# Patient Record
Sex: Female | Born: 1955 | Race: White | Hispanic: No | Marital: Married | Smoking: Former smoker
Health system: Southern US, Community
[De-identification: ages and names within clinical notes are randomized; demographics above are authoritative.]

## PROBLEM LIST (undated history)

## (undated) DIAGNOSIS — Z8719 Personal history of other diseases of the digestive system: Secondary | ICD-10-CM

## (undated) DIAGNOSIS — C189 Malignant neoplasm of colon, unspecified: Secondary | ICD-10-CM

## (undated) DIAGNOSIS — M797 Fibromyalgia: Secondary | ICD-10-CM

## (undated) DIAGNOSIS — I10 Essential (primary) hypertension: Secondary | ICD-10-CM

## (undated) HISTORY — PX: REPLACEMENT TOTAL KNEE: SUR1224

## (undated) HISTORY — PX: PORTACATH PLACEMENT: SHX2246

---

## 2018-05-05 ENCOUNTER — Inpatient Hospital Stay
Admission: EM | Admit: 2018-05-05 | Discharge: 2018-05-09 | DRG: 872 | Disposition: A | Discharge status: Home Health | Payer: Managed Care, Other (non HMO) | Attending: Internal Medicine | Admitting: Internal Medicine

## 2018-05-05 ENCOUNTER — Other Ambulatory Visit: Payer: Self-pay

## 2018-05-05 ENCOUNTER — Encounter: Payer: Self-pay | Admitting: Emergency Medicine

## 2018-05-05 ENCOUNTER — Emergency Department: Payer: Managed Care, Other (non HMO)

## 2018-05-05 DIAGNOSIS — M797 Fibromyalgia: Secondary | ICD-10-CM | POA: Diagnosis present

## 2018-05-05 DIAGNOSIS — C7951 Secondary malignant neoplasm of bone: Secondary | ICD-10-CM | POA: Diagnosis present

## 2018-05-05 DIAGNOSIS — T451X5A Adverse effect of antineoplastic and immunosuppressive drugs, initial encounter: Secondary | ICD-10-CM | POA: Diagnosis present

## 2018-05-05 DIAGNOSIS — D638 Anemia in other chronic diseases classified elsewhere: Secondary | ICD-10-CM | POA: Diagnosis present

## 2018-05-05 DIAGNOSIS — Z85038 Personal history of other malignant neoplasm of large intestine: Secondary | ICD-10-CM | POA: Diagnosis not present

## 2018-05-05 DIAGNOSIS — Z6820 Body mass index (BMI) 20.0-20.9, adult: Secondary | ICD-10-CM | POA: Diagnosis not present

## 2018-05-05 DIAGNOSIS — G620 Drug-induced polyneuropathy: Secondary | ICD-10-CM | POA: Diagnosis present

## 2018-05-05 DIAGNOSIS — Z79899 Other long term (current) drug therapy: Secondary | ICD-10-CM | POA: Diagnosis not present

## 2018-05-05 DIAGNOSIS — E440 Moderate protein-calorie malnutrition: Secondary | ICD-10-CM | POA: Diagnosis present

## 2018-05-05 DIAGNOSIS — B961 Klebsiella pneumoniae [K. pneumoniae] as the cause of diseases classified elsewhere: Secondary | ICD-10-CM | POA: Diagnosis present

## 2018-05-05 DIAGNOSIS — C787 Secondary malignant neoplasm of liver and intrahepatic bile duct: Secondary | ICD-10-CM | POA: Diagnosis present

## 2018-05-05 DIAGNOSIS — R7881 Bacteremia: Secondary | ICD-10-CM | POA: Diagnosis not present

## 2018-05-05 DIAGNOSIS — A409 Streptococcal sepsis, unspecified: Secondary | ICD-10-CM | POA: Diagnosis present

## 2018-05-05 DIAGNOSIS — Z800 Family history of malignant neoplasm of digestive organs: Secondary | ICD-10-CM | POA: Diagnosis not present

## 2018-05-05 DIAGNOSIS — K589 Irritable bowel syndrome without diarrhea: Secondary | ICD-10-CM | POA: Diagnosis present

## 2018-05-05 DIAGNOSIS — Z79891 Long term (current) use of opiate analgesic: Secondary | ICD-10-CM | POA: Diagnosis not present

## 2018-05-05 DIAGNOSIS — Z66 Do not resuscitate: Secondary | ICD-10-CM | POA: Diagnosis present

## 2018-05-05 DIAGNOSIS — I10 Essential (primary) hypertension: Secondary | ICD-10-CM | POA: Diagnosis present

## 2018-05-05 DIAGNOSIS — Z87891 Personal history of nicotine dependence: Secondary | ICD-10-CM | POA: Diagnosis not present

## 2018-05-05 DIAGNOSIS — D709 Neutropenia, unspecified: Secondary | ICD-10-CM | POA: Diagnosis present

## 2018-05-05 DIAGNOSIS — Z9221 Personal history of antineoplastic chemotherapy: Secondary | ICD-10-CM | POA: Diagnosis not present

## 2018-05-05 DIAGNOSIS — R5081 Fever presenting with conditions classified elsewhere: Secondary | ICD-10-CM | POA: Diagnosis present

## 2018-05-05 DIAGNOSIS — C189 Malignant neoplasm of colon, unspecified: Secondary | ICD-10-CM | POA: Diagnosis not present

## 2018-05-05 DIAGNOSIS — R197 Diarrhea, unspecified: Secondary | ICD-10-CM | POA: Diagnosis not present

## 2018-05-05 DIAGNOSIS — R1110 Vomiting, unspecified: Secondary | ICD-10-CM | POA: Diagnosis present

## 2018-05-05 DIAGNOSIS — R000 Tachycardia, unspecified: Secondary | ICD-10-CM | POA: Diagnosis present

## 2018-05-05 HISTORY — DX: Malignant neoplasm of colon, unspecified: C18.9

## 2018-05-05 HISTORY — DX: Fibromyalgia: M79.7

## 2018-05-05 HISTORY — DX: Personal history of other diseases of the digestive system: Z87.19

## 2018-05-05 HISTORY — DX: Essential (primary) hypertension: I10

## 2018-05-05 LAB — CBC WITH DIFFERENTIAL/PLATELET
BASOS ABS: 0 10*3/uL (ref 0–0.1)
BASOS PCT: 1 %
EOS ABS: 0 10*3/uL (ref 0–0.7)
Eosinophils Relative: 2 %
HCT: 30.5 % — ABNORMAL LOW (ref 35.0–47.0)
HEMOGLOBIN: 10.2 g/dL — AB (ref 12.0–16.0)
Lymphocytes Relative: 66 %
Lymphs Abs: 0.2 10*3/uL — ABNORMAL LOW (ref 1.0–3.6)
MCH: 28.9 pg (ref 26.0–34.0)
MCHC: 33.5 g/dL (ref 32.0–36.0)
MCV: 86.1 fL (ref 80.0–100.0)
MONO ABS: 0 10*3/uL — AB (ref 0.2–0.9)
Monocytes Relative: 14 %
NEUTROS ABS: 0.1 10*3/uL — AB (ref 1.4–6.5)
NEUTROS PCT: 17 %
Platelets: 191 10*3/uL (ref 150–440)
RBC: 3.54 MIL/uL — ABNORMAL LOW (ref 3.80–5.20)
RDW: 19.6 % — AB (ref 11.5–14.5)
WBC: 0.3 10*3/uL — CL (ref 3.6–11.0)

## 2018-05-05 LAB — URINALYSIS, ROUTINE W REFLEX MICROSCOPIC
BILIRUBIN URINE: NEGATIVE
Bacteria, UA: NONE SEEN
Glucose, UA: NEGATIVE mg/dL
HGB URINE DIPSTICK: NEGATIVE
Ketones, ur: NEGATIVE mg/dL
LEUKOCYTES UA: NEGATIVE
Nitrite: NEGATIVE
PROTEIN: 30 mg/dL — AB
Specific Gravity, Urine: 1.042 — ABNORMAL HIGH (ref 1.005–1.030)
pH: 6 (ref 5.0–8.0)

## 2018-05-05 LAB — LACTIC ACID, PLASMA: LACTIC ACID, VENOUS: 1.7 mmol/L (ref 0.5–1.9)

## 2018-05-05 LAB — COMPREHENSIVE METABOLIC PANEL
ALT: 22 U/L (ref 0–44)
ANION GAP: 11 (ref 5–15)
AST: 25 U/L (ref 15–41)
Albumin: 3.4 g/dL — ABNORMAL LOW (ref 3.5–5.0)
Alkaline Phosphatase: 205 U/L — ABNORMAL HIGH (ref 38–126)
BUN: 19 mg/dL (ref 8–23)
CALCIUM: 9.5 mg/dL (ref 8.9–10.3)
CO2: 22 mmol/L (ref 22–32)
Chloride: 101 mmol/L (ref 98–111)
Creatinine, Ser: 0.92 mg/dL (ref 0.44–1.00)
GFR calc Af Amer: >60 mL/min (ref >60)
Glucose, Bld: 163 mg/dL — ABNORMAL HIGH (ref 70–99)
POTASSIUM: 3.7 mmol/L (ref 3.5–5.1)
Sodium: 134 mmol/L — ABNORMAL LOW (ref 135–145)
TOTAL PROTEIN: 7.4 g/dL (ref 6.5–8.1)
Total Bilirubin: 2.7 mg/dL — ABNORMAL HIGH (ref 0.3–1.2)

## 2018-05-05 LAB — URINALYSIS, COMPLETE (UACMP) WITH MICROSCOPIC
BACTERIA UA: NONE SEEN
BILIRUBIN URINE: NEGATIVE
GLUCOSE, UA: NEGATIVE mg/dL
HGB URINE DIPSTICK: NEGATIVE
Ketones, ur: NEGATIVE mg/dL
LEUKOCYTES UA: NEGATIVE
NITRITE: NEGATIVE
PH: 6 (ref 5.0–8.0)
Protein, ur: 30 mg/dL — AB
SPECIFIC GRAVITY, URINE: 1.042 — AB (ref 1.005–1.030)

## 2018-05-05 LAB — PROTIME-INR
INR: 1.1
PROTHROMBIN TIME: 14.1 s (ref 11.4–15.2)

## 2018-05-05 MED ORDER — ACETAMINOPHEN 325 MG PO TABS
650.0000 mg | ORAL_TABLET | Freq: Four times a day (QID) | ORAL | Status: DC | PRN
  Administered 2018-05-05: 650 mg via ORAL
  Filled 2018-05-05: qty 2

## 2018-05-05 MED ORDER — PREGABALIN 50 MG PO CAPS
100.0000 mg | ORAL_CAPSULE | Freq: Two times a day (BID) | ORAL | Status: DC
Start: 2018-05-05 — End: 2018-05-05

## 2018-05-05 MED ORDER — LINACLOTIDE 145 MCG PO CAPS
145.0000 ug | ORAL_CAPSULE | Freq: Every day | ORAL | Status: DC
  Filled 2018-05-05 (×2): qty 1

## 2018-05-05 MED ORDER — PREGABALIN 50 MG PO CAPS
100.0000 mg | ORAL_CAPSULE | Freq: Every day | ORAL | Status: DC
  Administered 2018-05-06 – 2018-05-09 (×4): 100 mg via ORAL
  Filled 2018-05-05 (×4): qty 2

## 2018-05-05 MED ORDER — IOHEXOL 300 MG/ML  SOLN
100.0000 mL | Freq: Once | INTRAMUSCULAR | Status: AC | PRN
  Administered 2018-05-05: 100 mL via INTRAVENOUS

## 2018-05-05 MED ORDER — PREGABALIN 75 MG PO CAPS
200.0000 mg | ORAL_CAPSULE | Freq: Every day | ORAL | Status: DC
  Administered 2018-05-06 – 2018-05-08 (×3): 200 mg via ORAL
  Filled 2018-05-05 (×4): qty 1

## 2018-05-05 MED ORDER — ONDANSETRON HCL 4 MG PO TABS
4.0000 mg | ORAL_TABLET | Freq: Four times a day (QID) | ORAL | Status: DC | PRN
  Administered 2018-05-09: 05:00:00 4 mg via ORAL
  Filled 2018-05-05: qty 1

## 2018-05-05 MED ORDER — MELOXICAM 7.5 MG PO TABS
15.0000 mg | ORAL_TABLET | Freq: Every day | ORAL | Status: DC
  Administered 2018-05-06 – 2018-05-09 (×4): 15 mg via ORAL
  Filled 2018-05-05 (×4): qty 2

## 2018-05-05 MED ORDER — LOPERAMIDE HCL 2 MG PO CAPS: 2.0000 mg | ORAL_CAPSULE | Freq: Four times a day (QID) | ORAL | Status: DC | PRN

## 2018-05-05 MED ORDER — VANCOMYCIN HCL IN DEXTROSE 1-5 GM/200ML-% IV SOLN
1000.0000 mg | Freq: Once | INTRAVENOUS | Status: AC
  Administered 2018-05-05: 1000 mg via INTRAVENOUS
  Filled 2018-05-05: qty 200

## 2018-05-05 MED ORDER — ACETAMINOPHEN 650 MG RE SUPP: 650.0000 mg | Freq: Four times a day (QID) | RECTAL | Status: DC | PRN

## 2018-05-05 MED ORDER — SODIUM CHLORIDE 0.9 % IV SOLN
INTRAVENOUS | Status: DC
  Administered 2018-05-05 – 2018-05-09 (×8): via INTRAVENOUS

## 2018-05-05 MED ORDER — AMLODIPINE BESYLATE 10 MG PO TABS
10.0000 mg | ORAL_TABLET | Freq: Every day | ORAL | Status: DC
  Administered 2018-05-06 – 2018-05-09 (×4): 10 mg via ORAL
  Filled 2018-05-05 (×4): qty 1

## 2018-05-05 MED ORDER — ONDANSETRON HCL 4 MG/2ML IJ SOLN: 4.0000 mg | Freq: Four times a day (QID) | INTRAMUSCULAR | Status: DC | PRN

## 2018-05-05 MED ORDER — ONDANSETRON HCL 4 MG/2ML IJ SOLN
INTRAMUSCULAR | Status: AC
  Administered 2018-05-05: 18:00:00
  Filled 2018-05-05: qty 2

## 2018-05-05 MED ORDER — SODIUM CHLORIDE 0.9 % IV BOLUS
1000.0000 mL | Freq: Once | INTRAVENOUS | Status: AC
  Administered 2018-05-05: 1000 mL via INTRAVENOUS

## 2018-05-05 MED ORDER — SENNA 8.6 MG PO TABS
8.6000 mg | ORAL_TABLET | Freq: Every day | ORAL | Status: DC
  Filled 2018-05-05 (×2): qty 1

## 2018-05-05 MED ORDER — TRAMADOL HCL 50 MG PO TABS
50.0000 mg | ORAL_TABLET | Freq: Three times a day (TID) | ORAL | Status: DC | PRN
  Administered 2018-05-09: 50 mg via ORAL
  Filled 2018-05-05: qty 1

## 2018-05-05 MED ORDER — SODIUM CHLORIDE 0.9 % IV SOLN
2.0000 g | Freq: Two times a day (BID) | INTRAVENOUS | Status: DC
  Administered 2018-05-05 – 2018-05-06 (×2): 2 g via INTRAVENOUS
  Filled 2018-05-05 (×4): qty 2

## 2018-05-05 MED ORDER — METOPROLOL SUCCINATE ER 50 MG PO TB24
100.0000 mg | ORAL_TABLET | Freq: Every day | ORAL | Status: DC
  Administered 2018-05-06 – 2018-05-09 (×4): 100 mg via ORAL
  Filled 2018-05-05 (×4): qty 2

## 2018-05-05 MED ORDER — PIPERACILLIN-TAZOBACTAM 3.375 G IVPB 30 MIN
3.3750 g | Freq: Once | INTRAVENOUS | Status: AC
  Administered 2018-05-05: 3.375 g via INTRAVENOUS
  Filled 2018-05-05: qty 50

## 2018-05-05 MED ORDER — ENOXAPARIN SODIUM 40 MG/0.4ML ~~LOC~~ SOLN
40.0000 mg | SUBCUTANEOUS | Status: DC
  Administered 2018-05-06 – 2018-05-08 (×3): 40 mg via SUBCUTANEOUS
  Filled 2018-05-05 (×4): qty 0.4

## 2018-05-05 MED ORDER — VANCOMYCIN HCL IN DEXTROSE 1-5 GM/200ML-% IV SOLN
1000.0000 mg | INTRAVENOUS | Status: DC
  Administered 2018-05-06: 1000 mg via INTRAVENOUS
  Filled 2018-05-05 (×2): qty 200

## 2018-05-05 MED ORDER — LISINOPRIL 20 MG PO TABS
20.0000 mg | ORAL_TABLET | Freq: Every day | ORAL | Status: DC
  Administered 2018-05-06 – 2018-05-07 (×2): 20 mg via ORAL
  Filled 2018-05-05 (×2): qty 1

## 2018-05-05 MED ORDER — MILNACIPRAN HCL 50 MG PO TABS
50.0000 mg | ORAL_TABLET | Freq: Two times a day (BID) | ORAL | Status: DC
  Administered 2018-05-06 – 2018-05-09 (×7): 50 mg via ORAL
  Filled 2018-05-05 (×15): qty 1

## 2018-05-05 NOTE — Progress Notes (Signed)
CODE SEPSIS - PHARMACY COMMUNICATION  **Broad Spectrum Antibiotics should be administered within 1 hour of Sepsis diagnosis**  Time Code Sepsis Called/Page Received:  1805  Antibiotics Ordered: Vanc/Zosyn  Time of 1st antibiotic administration: 1834  Additional action taken by pharmacy:   If necessary, Name of Provider/Nurse Contacted:     Rocky Morel ,PharmD Clinical Pharmacist  05/05/2018  6:26 PM

## 2018-05-05 NOTE — ED Notes (Signed)
Patient transported to X-ray 

## 2018-05-05 NOTE — ED Notes (Signed)
Report called to [facility] RN and pt updated with plan of care. VSS.

## 2018-05-05 NOTE — ED Notes (Signed)
FIRST NURSE NOTE:  Pt has had N/V, fever at home of 103, stage IV colon CA.

## 2018-05-05 NOTE — ED Notes (Signed)
Nicole RN, aware of bed assigned  

## 2018-05-05 NOTE — Progress Notes (Signed)
   [place] at [facility] Day: 0 days Leslie Wallace is a 62 y.o. female presenting with Fever .   Advance care planning discussed with patient and her husband at bedside. All questions in regards to overall condition and expected prognosis answered.  She has met a palliative care physician in [place].  She is okay with all the care until cardiac arrest and wants to be a DNR if cardiac arrest happens.    CODE STATUS: DO NOT RESUSCITATE Time spent: 18 minutes

## 2018-05-05 NOTE — ED Provider Notes (Addendum)
[facility] Emergency Department Provider Note  Time seen: 6:03 PM  I have reviewed the triage vital signs and the nursing notes.   HISTORY  Chief Complaint Fever    HPI Leslie Wallace is a 62 y.o. 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  According to the patient she often will become nauseated with episodes of vomiting but states it has been much worse since last night throughout the day today.  Developed a fever 103 at home today so the patient came to the emergency department for evaluation.  Patient denies any abdominal pain, cough or congestion denies dysuria or cloudy urine.  Largely negative review of systems.  States she has had a very small amount of diarrhea today but has been able to pass gas.  States frequent vomiting now bilious vomiting.   Past Medical History:  Diagnosis Date  . Colon cancer ([place])   . Fibromyalgia   . History of IBS   . Hypertension     There are no active problems to display for this patient.   Prior to Admission medications   Not on File    No Known Allergies  No family history on file.  Social History Social History   Tobacco Use  . Smoking status: Former Research scientist (life sciences)  . Smokeless tobacco: Never Used  Substance Use Topics  . Alcohol use: Not Currently    Comment: Rare  . Drug use: Never    Review of Systems Constitutional: Positive for fever at home. Eyes: Negative for visual complaints ENT: Negative for recent illness/congestion Cardiovascular: Negative for chest pain. Respiratory: Negative for shortness of breath. Gastrointestinal: Negative for abdominal pain positive for nausea vomiting.  Small amount of diarrhea today.  Positive for flatus. Genitourinary: Negative for urinary compaints Musculoskeletal: Negative for musculoskeletal  complaints Skin: Negative for skin complaints  Neurological: Negative for headache All other ROS negative  ____________________________________________   PHYSICAL EXAM:  VITAL SIGNS: ED Triage Vitals  Enc Vitals Group     BP --      Pulse Rate 05/05/18 1719 (!) 122     Resp 05/05/18 1719 (!) 22     Temp 05/05/18 1719 99.4 F (37.4 C)     Temp Source 05/05/18 1719 Oral     SpO2 --      Weight 05/05/18 1716 121 lb (54.9 kg)     Height 05/05/18 1716 5\' 4"  (1.626 m)     Head Circumference --      Peak Flow --      Pain Score 05/05/18 1715 5     Pain Loc --      Pain Edu? --      Excl. in [place]? --    Constitutional: Alert and oriented. Well appearing and in no distress. Eyes: Normal exam ENT   Head: Normocephalic and atraumatic.   Mouth/Throat: Mucous membranes are moist. Cardiovascular: Regular rhythm rate around 120 bpm.  No obvious murmur. Respiratory: Normal respiratory effort without tachypnea nor retractions. Breath sounds are clear Gastrointestinal: Soft and nontender. No distention.   Musculoskeletal: Nontender with normal range of motion in all extremities. Neurologic:  Normal speech and language. No gross focal neurologic deficits  Skin:  Skin is warm, dry and intact.  Psychiatric: Mood and affect are normal.  ____________________________________________    EKG  EKG reviewed and interpreted by myself shows sinus tachycardia  122 bpm with a narrow QRS, normal axis, largely normal intervals, nonspecific ST changes.  No ST elevation.  ____________________________________________    RADIOLOGY  IMPRESSION: Loops of mildly dilated bowel without air-fluid levels. Question a degree of ileus or enteritis. Bowel obstruction felt to be less likely. No evident free air. Probable metastatic disease involving the right sacral ala.  IMPRESSION: Port-A-Cath tip at cavoatrial junction. No pneumothorax. No edema or consolidation. No adenopathy. Old trauma proximal  left humerus with healing.  IMPRESSION: 1. Fluid-filled loops of colon with mild mucosal enhancement which may reflect an ileus versus mild early colitis. 2. Multiple hepatic hypodense masses most consistent with metastatic disease. Sclerotic bone lesion in the right side of the sacrum, right posterior ilium most concerning for metastatic disease. 3. 4.1 x 8.9 cm cystic mass between the uterus and rectum of uncertain etiology. This may reflect a cystic ovarian neoplasm versus pelvic inclusion cyst versus postoperative seroma.  ____________________________________________   INITIAL IMPRESSION / ASSESSMENT AND PLAN / ED COURSE  Pertinent labs & imaging results that were available during my care of the patient were reviewed by me and considered in my medical decision making (see chart for details).  Patient presents to the emergency department for fever nausea vomiting.  Given the patient's tachycardia and fever she meets sepsis criteria.  Differential would include infectious etiology such as pneumonia, urinary tract infection, hematologic infection, neutropenic fever, abdominal infection.  We will check labs, cultures, start broad-spectrum antibiotics and IV fluids.  I have ordered chest and abdominal imaging.  Chest x-ray appears to be clear on my evaluation.  Abdominal imaging shows several largely distended loops of intestine.  With the patient's vomiting we will proceed with CT imaging to help rule out a bowel obstruction.  CT negative for bowel obstruction does show fluid-filled loops of colon possible colitis versus ileus.  Patient's labs have resulted showing neutropenia, receiving broad-spectrum antibiotics.  Will admit to the hospitalist service for continued treatment for neutropenic fever of unknown origin possibly related to mild colitis.  Patient agreeable to plan of care.  CRITICAL CARE Performed by: Harvest Dark   Total critical care time: 30 minutes  Critical care  time was exclusive of separately billable procedures and treating other patients.  Critical care was necessary to treat or prevent imminent or life-threatening deterioration.  Critical care was time spent personally by me on the following activities: development of treatment plan with patient and/or surrogate as well as nursing, discussions with consultants, evaluation of patient's response to treatment, examination of patient, obtaining history from patient or surrogate, ordering and performing treatments and interventions, ordering and review of laboratory studies, ordering and review of radiographic studies, pulse oximetry and re-evaluation of patient's condition.   ____________________________________________   FINAL CLINICAL IMPRESSION(S) / ED DIAGNOSES  Nausea vomiting Fever    Harvest Dark, MD 05/05/18 2029    Harvest Dark, MD 05/05/18 2029

## 2018-05-05 NOTE — ED Notes (Signed)
Patient transported to CT 

## 2018-05-05 NOTE — H&P (Signed)
[place] at [place] NAME: Leslie Wallace    MR#:  [id number]  DATE OF BIRTH:  06/25/1956  DATE OF ADMISSION:  05/05/2018  PRIMARY CARE PHYSICIAN: System, Pcp Not In   REQUESTING/REFERRING PHYSICIAN: Dr. Harvest Dark  CHIEF COMPLAINT:   Chief Complaint  Patient presents with  . Fever    HISTORY OF PRESENT ILLNESS:  Leslie Wallace  is a 62 y.o. female with a known history of metastatic colon cancer diagnosed in 2017, with metastatic disease to liver and bones currently on chemotherapy, fibromyalgia, hypertension and irritable bowel syndrome who is visiting her mom here from [place] presents to hospital secondary to fevers. Patient was diagnosed with colon cancer in 2017, finished chemotherapy, however progression of the disease noted in 2018 finished a series of chemotherapy.  She has had trouble with neutropenic fevers at the time.  Most recent scan was about a month ago showing progression of disease in the liver.  She is on palliative chemotherapy and has last dose with high-dose irinotecan was about a week ago.  Patient always had constipation and diarrhea alternating due to her IBS.  She came to visit her mom and was noticed to be very weak and having chills today.  Her husband checked her temperature and it was 103 F at home.  Patient denies any new complaints.  No dysuria or increased frequency of urination.  White count was 0.3K with an absolute neutrophil count of 51.  She is being admitted for neutropenic fevers at this time.  PAST MEDICAL HISTORY:   Past Medical History:  Diagnosis Date  . Colon cancer ([place])    stage 4- mets to liver adn bone  . Fibromyalgia   . History of IBS   . Hypertension     PAST SURGICAL HISTORY:   Past Surgical History:  Procedure Laterality Date  . PORTACATH PLACEMENT    . REPLACEMENT TOTAL KNEE      SOCIAL HISTORY:   Social History   Tobacco Use  . Smoking status: Former Research scientist (life sciences)  .  Smokeless tobacco: Never Used  . Tobacco comment: quit about 12 years ago  Substance Use Topics  . Alcohol use: Not Currently    Comment: Rare    FAMILY HISTORY:   Family History  Problem Relation Age of Onset  . Colon cancer Father     DRUG ALLERGIES:  No Known Allergies  REVIEW OF SYSTEMS:   Review of Systems  Constitutional: Positive for chills, fever and malaise/fatigue. Negative for weight loss.  HENT: Negative for ear discharge, ear pain, hearing loss, nosebleeds and tinnitus.   Eyes: Negative for blurred vision, double vision and photophobia.  Respiratory: Negative for cough, hemoptysis, shortness of breath and wheezing.   Cardiovascular: Negative for chest pain, palpitations, orthopnea and leg swelling.  Gastrointestinal: Positive for constipation, diarrhea and nausea. Negative for abdominal pain, heartburn, melena and vomiting.  Genitourinary: Negative for dysuria, frequency and urgency.  Musculoskeletal: Negative for back pain, myalgias and neck pain.  Skin: Negative for rash.  Neurological: Negative for dizziness, tingling, sensory change, speech change, focal weakness and headaches.  Endo/Heme/Allergies: Does not bruise/bleed easily.  Psychiatric/Behavioral: Negative for depression.    MEDICATIONS AT HOME:   Prior to Admission medications   Medication Sig Start Date End Date Taking? Authorizing Provider  amLODipine (NORVASC) 10 MG tablet Take 10 mg by mouth daily.   Yes [provider]  denosumab (XGEVA) 120 MG/1.7ML SOLN injection Inject 120 mg into  the skin every 28 (twenty-eight) days.   Yes [provider]  dexamethasone (DECADRON) 4 MG tablet Take 8 mg by mouth every evening. On days 2 and 3 of each cycle   Yes [provider]  linaclotide (LINZESS) 145 MCG CAPS capsule Take 145 mg by mouth daily.   Yes [provider]  lisinopril (PRINIVIL,ZESTRIL) 20 MG tablet Take 20 mg by mouth daily.   Yes [provider]    loperamide (IMODIUM) 2 MG capsule Take 2 mg by mouth 4 (four) times daily as needed for diarrhea or loose stools.    Yes [provider]  meloxicam (MOBIC) 15 MG tablet Take 15 mg by mouth daily.   Yes [provider]  metoprolol succinate (TOPROL-XL) 100 MG 24 hr tablet Take 100 mg by mouth daily.   Yes [provider]  Milnacipran (SAVELLA) 50 MG TABS tablet Take 50 mg by mouth 2 (two) times daily.   Yes [provider]  ondansetron (ZOFRAN) 8 MG tablet Take 8 mg by mouth every 8 (eight) hours as needed for nausea or vomiting.    Yes [provider]  pregabalin (LYRICA) 100 MG capsule Take 100-200 mg by mouth 2 (two) times daily. 100 mg every morning and 200 mg at bedtime   Yes [provider]  senna (SENNA LAXATIVE) 8.6 MG tablet Take 8.6 mg by mouth at bedtime.   Yes [provider]  traMADol (ULTRAM) 50 MG tablet Take 50 mg by mouth every 8 (eight) hours as needed for moderate pain or severe pain.    Yes [provider]      VITAL SIGNS:  Blood pressure (!) 144/82, pulse (!) 113, temperature 99.4 F (37.4 C), temperature source Oral, resp. rate (!) 24, height 5\' 4"  (1.626 m), weight 54.9 kg (121 lb), SpO2 99 %.  PHYSICAL EXAMINATION:   Physical Exam  GENERAL:  62 y.o.-year-old ill appearing patient lying in the bed with no acute distress.  EYES: Pupils equal, round, reactive to light and accommodation. No scleral icterus. Extraocular muscles intact.  HEENT: Head atraumatic, normocephalic. Oropharynx and nasopharynx clear.  NECK:  Supple, no jugular venous distention. No thyroid enlargement, no tenderness.  LUNGS: Normal breath sounds bilaterally, no wheezing, rales,rhonchi or crepitation. No use of accessory muscles of respiration. Decreased bibasilar breath sounds CARDIOVASCULAR: S1, S2 normal. No  rubs, or gallops. 2/6 systolic murmur present ABDOMEN: Soft, nontender, nondistended. Bowel sounds present. No  organomegaly or mass.  EXTREMITIES: No pedal edema, cyanosis, or clubbing.  NEUROLOGIC: Cranial nerves II through XII are intact. Muscle strength 5/5 in all extremities. Sensation intact. Gait not checked.  PSYCHIATRIC: The patient is alert and oriented x 3.  SKIN: No obvious rash, lesion, or ulcer.   LABORATORY PANEL:   CBC Recent Labs  Lab 05/05/18 1727  WBC 0.3*  HGB 10.2*  HCT 30.5*  PLT 191   ------------------------------------------------------------------------------------------------------------------  Chemistries  Recent Labs  Lab 05/05/18 1727  NA 134*  K 3.7  CL 101  CO2 22  GLUCOSE 163*  BUN 19  CREATININE 0.92  CALCIUM 9.5  AST 25  ALT 22  ALKPHOS 205*  BILITOT 2.7*   ------------------------------------------------------------------------------------------------------------------  Cardiac Enzymes No results for input(s): TROPONINI in the last 168 hours. ------------------------------------------------------------------------------------------------------------------  RADIOLOGY:  Dg Chest 2 View  Result Date: 05/05/2018 CLINICAL DATA:  Fever.  Colon carcinoma EXAM: CHEST - 2 VIEW COMPARISON:  None. FINDINGS: Port-A-Cath tip is at the cavoatrial junction. No pneumothorax. Lungs are clear. Heart  size and pulmonary vascularity are normal. No adenopathy. There is evidence of old trauma involving the proximal left humerus. Bones overall appear osteoporotic. IMPRESSION: Port-A-Cath tip at cavoatrial junction. No pneumothorax. No edema or consolidation. No adenopathy. Old trauma proximal left humerus with healing. Electronically Signed   By: Lowella Grip III M.D.   On: 05/05/2018 18:03   Ct Abdomen Pelvis W Contrast  Result Date: 05/05/2018 CLINICAL DATA:  Bowel obstruction, vomiting, fever. History of colon cancer. EXAM: CT ABDOMEN AND PELVIS WITH CONTRAST TECHNIQUE: Multidetector CT imaging of the abdomen and pelvis was performed using the standard  protocol following bolus administration of intravenous contrast. CONTRAST:  123mL OMNIPAQUE IOHEXOL 300 MG/ML  SOLN COMPARISON:  None. FINDINGS: Lower chest: No acute abnormality. Hepatobiliary: Multiple hepatic hypodense masses most consistent with metastatic disease. Largest inferior right hepatic mass demonstrates small calcifications. Severely distended gallbladder with a 17 mm gallstone. No intrahepatic or extrahepatic biliary ductal dilatation. Pancreas: Unremarkable. No pancreatic ductal dilatation or surrounding inflammatory changes. Spleen: Normal in size without focal abnormality. Adrenals/Urinary Tract: Normal adrenal glands. Normal kidneys. Normal bladder. Stomach/Bowel: Fluid-filled loops of colon with mild mucosal enhancement. No pneumatosis, pneumoperitoneum or portal venous gas. No pericolonic inflammatory changes. Vascular/Lymphatic: No significant vascular findings are present. No enlarged abdominal or pelvic lymph nodes. Reproductive: Uterus is normal. 4.1 x 8.9 cm cystic mass between the uterus and rectum of uncertain etiology. Other: No fluid collection or hematoma. Musculoskeletal: No acute osseous abnormality. Sclerotic bone lesion in the right side of the sacrum, right posterior ilium most concerning for metastatic disease. IMPRESSION: 1. Fluid-filled loops of colon with mild mucosal enhancement which may reflect an ileus versus mild early colitis. 2. Multiple hepatic hypodense masses most consistent with metastatic disease. Sclerotic bone lesion in the right side of the sacrum, right posterior ilium most concerning for metastatic disease. 3. 4.1 x 8.9 cm cystic mass between the uterus and rectum of uncertain etiology. This may reflect a cystic ovarian neoplasm versus pelvic inclusion cyst versus postoperative seroma. Electronically Signed   By: Kathreen Devoid   On: 05/05/2018 18:48   Dg Abd 2 Views  Result Date: 05/05/2018 CLINICAL DATA:  Vomiting and fever.  Colon carcinoma EXAM: ABDOMEN -  2 VIEW COMPARISON:  None. FINDINGS: Supine and upright images obtained. There are loops of dilated bowel in the mid abdomen. No air-fluid levels. No free air. No abnormal calcifications evident. Note that there is air in the rectum. There is sclerosis in the right sacral ala, likely metastatic in etiology. IMPRESSION: Loops of mildly dilated bowel without air-fluid levels. Question a degree of ileus or enteritis. Bowel obstruction felt to be less likely. No evident free air. Probable metastatic disease involving the right sacral ala. Electronically Signed   By: Lowella Grip III M.D.   On: 05/05/2018 18:05    EKG:   Orders placed or performed during the hospital encounter of 05/05/18  . EKG 12-Lead  . EKG 12-Lead    IMPRESSION AND PLAN:   Leslie Wallace  is a 62 y.o. female with a known history of metastatic colon cancer diagnosed in 2017, with metastatic disease to liver and bones currently on chemotherapy, fibromyalgia, hypertension and irritable bowel syndrome who is visiting her mom here from [place] presents to hospital secondary to fevers.   1.  Febrile neutropenia-urine analysis is pending, chest x-ray with no infiltrate. -CT of the abdomen showing may be early colitis. -Patient denies any specific GI symptoms.  Follow-up blood cultures -Started on vancomycin and cefepime -  Absolute neutrophil count is 51.  Follow-up.  Consider neulasta if needed  2.  Metastatic colon cancer-[place]ologist [place]. -Metastatic disease to liver and bones.  Currently on palliative chemotherapy with high-dose irinotecan -Oncology consulted  3.  Hypertension-continue Norvasc, lisinopril, Toprol  4.  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  Continue Lyrica. -Also on meloxicam for her fibromyalgia.  5.  DVT prophylaxis-Lovenox -   All the records are reviewed and case discussed with ED provider. Management plans discussed with the patient, family and they  are in agreement.  CODE STATUS: DNR  TOTAL TIME TAKING CARE OF THIS PATIENT: 50 minutes.    Gladstone Lighter M.D on 05/05/2018 at 8:13 PM  Between 7am to 6pm - Pager - [phone]  After 6pm go to [email or web address] - password [email or web address] [facility]  Office  [phone]  CC: Primary care physician; System, Pcp Not In

## 2018-05-05 NOTE — ED Triage Notes (Signed)
Pt presents to ED with c/o vomiting and fever. Pt reports at home fever of 103. Pt states last chemo Tuesday 6/25. Pt states hx of Stage IV colon cancer with mets to bone and liver. Pt states vomiting started yesterday.

## 2018-05-05 NOTE — Progress Notes (Signed)
Pharmacy Antibiotic Note  Leslie Wallace is a 62 y.o. female admitted on 05/05/2018 with febrile neutropenia.  Pharmacy has been consulted for vancomycin and cefepime dosing. Pt received vancomycin 1 g x1 and Zosyn 3.375g IV x1 in the ED.   Plan: Cefepime 2 g IV q12h  Vanc 1g IV q18h to start 18h after initial dose (pt received loading dose). Trough before 4th dose.   Ke 0.050, half life 13.9 h, Vd 68.4 L Goal trough 15-20 mcg/ml  Will need to continue to monitor renal function   Height: 5\' 4"  (162.6 cm) Weight: 121 lb (54.9 kg) IBW/kg (Calculated) : 54.7  Temp (24hrs), Avg:99.4 F (37.4 C), Min:99.4 F (37.4 C), Max:99.4 F (37.4 C)  Recent Labs  Lab 05/05/18 1727  WBC 0.3*  CREATININE 0.92  LATICACIDVEN 1.7    Estimated Creatinine Clearance: 54.7 mL/min (by C-G formula based on SCr of 0.92 mg/dL).    No Known Allergies  Antimicrobials this admission: Zosyn x1 7/2 Cefepime 7/2>> vanc 7/2 >>   Dose adjustments this admission:  Microbiology results:  Thank you for allowing pharmacy to be a part of this patient's care.  Rocky Morel 05/05/2018 8:27 PM

## 2018-05-06 DIAGNOSIS — Z87891 Personal history of nicotine dependence: Secondary | ICD-10-CM

## 2018-05-06 DIAGNOSIS — R5081 Fever presenting with conditions classified elsewhere: Secondary | ICD-10-CM

## 2018-05-06 DIAGNOSIS — D709 Neutropenia, unspecified: Secondary | ICD-10-CM

## 2018-05-06 DIAGNOSIS — C787 Secondary malignant neoplasm of liver and intrahepatic bile duct: Secondary | ICD-10-CM

## 2018-05-06 DIAGNOSIS — B961 Klebsiella pneumoniae [K. pneumoniae] as the cause of diseases classified elsewhere: Secondary | ICD-10-CM

## 2018-05-06 DIAGNOSIS — C7951 Secondary malignant neoplasm of bone: Secondary | ICD-10-CM

## 2018-05-06 DIAGNOSIS — R197 Diarrhea, unspecified: Secondary | ICD-10-CM

## 2018-05-06 DIAGNOSIS — R7881 Bacteremia: Secondary | ICD-10-CM

## 2018-05-06 DIAGNOSIS — C189 Malignant neoplasm of colon, unspecified: Secondary | ICD-10-CM

## 2018-05-06 LAB — BLOOD CULTURE ID PANEL (REFLEXED)
ACINETOBACTER BAUMANNII: NOT DETECTED
CANDIDA ALBICANS: NOT DETECTED
CANDIDA GLABRATA: NOT DETECTED
CANDIDA KRUSEI: NOT DETECTED
CANDIDA PARAPSILOSIS: NOT DETECTED
CANDIDA TROPICALIS: NOT DETECTED
Carbapenem resistance: NOT DETECTED
ESCHERICHIA COLI: NOT DETECTED
Enterobacter cloacae complex: NOT DETECTED
Enterobacteriaceae species: DETECTED — AB
Enterococcus species: NOT DETECTED
Haemophilus influenzae: NOT DETECTED
KLEBSIELLA OXYTOCA: NOT DETECTED
KLEBSIELLA PNEUMONIAE: DETECTED — AB
Listeria monocytogenes: NOT DETECTED
NEISSERIA MENINGITIDIS: NOT DETECTED
PROTEUS SPECIES: NOT DETECTED
Pseudomonas aeruginosa: NOT DETECTED
STREPTOCOCCUS AGALACTIAE: NOT DETECTED
Serratia marcescens: NOT DETECTED
Staphylococcus aureus (BCID): NOT DETECTED
Staphylococcus species: NOT DETECTED
Streptococcus pneumoniae: NOT DETECTED
Streptococcus pyogenes: NOT DETECTED
Streptococcus species: DETECTED — AB

## 2018-05-06 LAB — BASIC METABOLIC PANEL
ANION GAP: 9 (ref 5–15)
BUN: 20 mg/dL (ref 8–23)
CO2: 20 mmol/L — AB (ref 22–32)
Calcium: 8.4 mg/dL — ABNORMAL LOW (ref 8.9–10.3)
Chloride: 107 mmol/L (ref 98–111)
Creatinine, Ser: 0.87 mg/dL (ref 0.44–1.00)
GFR calc Af Amer: >60 mL/min (ref >60)
GFR calc non Af Amer: >60 mL/min (ref >60)
GLUCOSE: 121 mg/dL — AB (ref 70–99)
POTASSIUM: 3.6 mmol/L (ref 3.5–5.1)
Sodium: 136 mmol/L (ref 135–145)

## 2018-05-06 LAB — CBC WITH DIFFERENTIAL/PLATELET
BASOS PCT: 1 %
Basophils Absolute: 0 10*3/uL (ref 0–0.1)
Eosinophils Absolute: 0 10*3/uL (ref 0–0.7)
Eosinophils Relative: 2 %
HEMATOCRIT: 26.8 % — AB (ref 35.0–47.0)
HEMOGLOBIN: 8.9 g/dL — AB (ref 12.0–16.0)
LYMPHS ABS: 0.3 10*3/uL — AB (ref 1.0–3.6)
LYMPHS PCT: 66 %
MCH: 28.9 pg (ref 26.0–34.0)
MCHC: 33.3 g/dL (ref 32.0–36.0)
MCV: 86.7 fL (ref 80.0–100.0)
MONOS PCT: 17 %
Monocytes Absolute: 0.1 10*3/uL — ABNORMAL LOW (ref 0.2–0.9)
NEUTROS ABS: 0.1 10*3/uL — AB (ref 1.4–6.5)
Neutrophils Relative %: 14 %
Platelets: 129 10*3/uL — ABNORMAL LOW (ref 150–440)
RBC: 3.09 MIL/uL — ABNORMAL LOW (ref 3.80–5.20)
RDW: 19.4 % — ABNORMAL HIGH (ref 11.5–14.5)
SMEAR REVIEW: ADEQUATE
WBC: 0.5 10*3/uL — CL (ref 3.6–11.0)

## 2018-05-06 MED ORDER — SODIUM CHLORIDE 0.9 % IV SOLN
2.0000 g | Freq: Every day | INTRAVENOUS | Status: DC
  Administered 2018-05-06 – 2018-05-09 (×4): 2 g via INTRAVENOUS
  Filled 2018-05-06 (×4): qty 2

## 2018-05-06 MED ORDER — TBO-FILGRASTIM 300 MCG/0.5ML ~~LOC~~ SOSY
300.0000 ug | PREFILLED_SYRINGE | Freq: Every day | SUBCUTANEOUS | Status: DC
  Administered 2018-05-06 – 2018-05-09 (×4): 300 ug via SUBCUTANEOUS
  Filled 2018-05-06 (×4): qty 0.5

## 2018-05-06 NOTE — Consult Note (Signed)
[place] CONSULT NOTE  Patient Care Team: System, Pcp Not In as PCP - General  CHIEF COMPLAINTS/PURPOSE OF CONSULTATION:  Metastatic colon cancer   HISTORY OF PRESENTING ILLNESS:  Leslie Wallace 62 y.o.  female with a history of metastatic colon cancer-status post multiple lines of therapy most recently on palliative  Avastin plus irinotecan is currently admitted to the hospital for fevers.  Patient lives in [place]; and gets her oncology care in [place].  She is visiting her mother.  patient states that she was diagnosed with colon cancer, approximately 2 years ago most recently patient was noted to have progression of disease in the liver she was started on irinotecan plus Avastin.  Approximately 1 week ago.  She did not have Neulasta.   On the day of admission.  patient noted to have fevers at home and chills.  Denies any worsening cough.  Denies any shortness of breath.  Mild to moderate diarrhea.   Denies abdominal pain.  In the emergency room CT scan showed-mildly dilated bowel loops/mild thickening possible colitis;  Also showed multiple liver mets and bone mets.  Patient has been neutropenic.  Patient is on broad-spectrum antibiotics with cefepime plus vancomycin.  Patient  blood culture 1/2 positive for-Streptococcus and Klebsiella.  Patient has a port in place.  Poor appetite.  No nausea no vomiting.  Positive for fatigue.  ROS: A complete 10 point review of system is done which is negative except mentioned above in history of present illness  MEDICAL HISTORY:  Past Medical History:  Diagnosis Date  . Colon cancer ([place])    stage 4- mets to liver adn bone  . Fibromyalgia   . History of IBS   . Hypertension     SURGICAL HISTORY: Past Surgical History:  Procedure Laterality Date  . PORTACATH PLACEMENT    . REPLACEMENT TOTAL KNEE      SOCIAL HISTORY: Patient is a retired Marine scientist.  She lives with her husband in [place].  Remote  history of smoking no alcohol abuse. Social History   Socioeconomic History  . Marital status: Married    Spouse name: Not on file  . Number of children: Not on file  . Years of education: Not on file  . Highest education level: Not on file  Occupational History  . Not on file  Social Needs  . Financial resource strain: Not on file  . Food insecurity:    Worry: Not on file    Inability: Not on file  . Transportation needs:    Medical: Not on file    Non-medical: Not on file  Tobacco Use  . Smoking status: Former Research scientist (life sciences)  . Smokeless tobacco: Never Used  . Tobacco comment: quit about 12 years ago  Substance and Sexual Activity  . Alcohol use: Not Currently    Comment: Rare  . Drug use: Never  . Sexual activity: Not on file  Lifestyle  . Physical activity:    Days per week: Not on file    Minutes per session: Not on file  . Stress: Not on file  Relationships  . Social connections:    Talks on phone: Not on file    Gets together: Not on file    Attends religious service: Not on file    Active member of club or organization: Not on file    Attends meetings of clubs or organizations: Not on file    Relationship status: Not on file  . Intimate partner violence:  Fear of current or ex partner: Not on file    Emotionally abused: Not on file    Physically abused: Not on file    Forced sexual activity: Not on file  Other Topics Concern  . Not on file  Social History Narrative   Lives at home -independent at baseline    FAMILY HISTORY: Family History  Problem Relation Age of Onset  . Colon cancer Father     ALLERGIES:  has No Known Allergies.  MEDICATIONS:  Current Facility-Administered Medications  Medication Dose Route Frequency Provider Last Rate Last Dose  . 0.9 %  sodium chloride infusion   Intravenous Continuous Gladstone Lighter, MD 75 mL/hr at 05/06/18 2025    . acetaminophen (TYLENOL) tablet 650 mg  650 mg Oral Q6H PRN Gladstone Lighter, MD   650 mg at  05/05/18 2143   Or  . acetaminophen (TYLENOL) suppository 650 mg  650 mg Rectal Q6H PRN Gladstone Lighter, MD      . amLODipine (NORVASC) tablet 10 mg  10 mg Oral Daily Gladstone Lighter, MD   10 mg at 05/06/18 1045  . cefTRIAXone (ROCEPHIN) 2 g in sodium chloride 0.9 % 100 mL IVPB  2 g Intravenous q1800 Demetrios Loll, MD   Stopped at 05/06/18 1747  . enoxaparin (LOVENOX) injection 40 mg  40 mg Subcutaneous Q24H Gladstone Lighter, MD   40 mg at 05/06/18 2125  . lisinopril (PRINIVIL,ZESTRIL) tablet 20 mg  20 mg Oral Daily Gladstone Lighter, MD   20 mg at 05/06/18 1048  . loperamide (IMODIUM) capsule 2 mg  2 mg Oral QID PRN Gladstone Lighter, MD      . meloxicam (MOBIC) tablet 15 mg  15 mg Oral Daily Gladstone Lighter, MD   15 mg at 05/06/18 1048  . metoprolol succinate (TOPROL-XL) 24 hr tablet 100 mg  100 mg Oral Daily Gladstone Lighter, MD   100 mg at 05/06/18 1046  . Milnacipran (SAVELLA) tablet TABS 50 mg  50 mg Oral BID Gladstone Lighter, MD   50 mg at 05/06/18 2125  . ondansetron (ZOFRAN) tablet 4 mg  4 mg Oral Q6H PRN Gladstone Lighter, MD       Or  . ondansetron (ZOFRAN) injection 4 mg  4 mg Intravenous Q6H PRN Gladstone Lighter, MD      . pregabalin (LYRICA) capsule 100 mg  100 mg Oral Daily Gladstone Lighter, MD   100 mg at 05/06/18 1047   And  . pregabalin (LYRICA) capsule 200 mg  200 mg Oral QHS Gladstone Lighter, MD   200 mg at 05/06/18 2125  . senna (SENOKOT) tablet 8.6 mg  8.6 mg Oral QHS Gladstone Lighter, MD      . Tbo-Filgrast[facility]) injection 300 mcg  300 mcg Subcutaneous Daily Charlaine Dalton R, MD   300 mcg at 05/06/18 1354  . traMADol (ULTRAM) tablet 50 mg  50 mg Oral Q8H PRN Gladstone Lighter, MD          .  PHYSICAL EXAMINATION:  Vitals:   05/06/18 1435 05/06/18 1925  BP: (!) 102/57 106/68  Pulse: 93 87  Resp:  18  Temp: 98.6 F (37 C) 99.9 F (37.7 C)  SpO2: 99% 100%   Filed Weights   05/05/18 1716 05/06/18 0626  Weight: 121 lb (54.9  kg) 118 lb 6.4 oz (53.7 kg)    GENERAL: Well-nourished well-developed; Alert, no distress and comfortable.   Accompanied by husband.  Appears toxic. EYES: no pallor or icterus OROPHARYNX: no thrush or ulceration.  NECK: supple, no masses felt LYMPH:  no palpable lymphadenopathy in the cervical, axillary or inguinal regions LUNGS: decreased breath sounds to auscultation at bases and  No wheeze or crackles HEART/CVS: regular rate & rhythm and no murmurs; No lower extremity edema ABDOMEN: abdomen soft, non-tender and normal bowel sounds Musculoskeletal:no cyanosis of digits and no clubbing  PSYCH: alert & oriented x 3 with fluent speech NEURO: no focal motor/sensory deficits SKIN:  no rashes or significant lesions; Mediport in place.  No signs of infection/tenderness.  LABORATORY DATA:  I have reviewed the data as listed Lab Results  Component Value Date   WBC 0.5 (LL) 05/06/2018   HGB 8.9 (L) 05/06/2018   HCT 26.8 (L) 05/06/2018   MCV 86.7 05/06/2018   PLT 129 (L) 05/06/2018   Recent Labs    05/05/18 1727 05/06/18 0524  NA 134* 136  K 3.7 3.6  CL 101 107  CO2 22 20*  GLUCOSE 163* 121*  BUN 19 20  CREATININE 0.92 0.87  CALCIUM 9.5 8.4*  GFRNONAA >60 >60  GFRAA >60 >60  PROT 7.4  --   ALBUMIN 3.4*  --   AST 25  --   ALT 22  --   ALKPHOS 205*  --   BILITOT 2.7*  --     RADIOGRAPHIC STUDIES: I have personally reviewed the radiological images as listed and agreed with the findings in the report. Dg Chest 2 View  Result Date: 05/05/2018 CLINICAL DATA:  Fever.  Colon carcinoma EXAM: CHEST - 2 VIEW COMPARISON:  None. FINDINGS: Port-A-Cath tip is at the cavoatrial junction. No pneumothorax. Lungs are clear. Heart size and pulmonary vascularity are normal. No adenopathy. There is evidence of old trauma involving the proximal left humerus. Bones overall appear osteoporotic. IMPRESSION: Port-A-Cath tip at cavoatrial junction. No pneumothorax. No edema or consolidation. No  adenopathy. Old trauma proximal left humerus with healing. Electronically Signed   By: Lowella Grip III M.D.   On: 05/05/2018 18:03   Ct Abdomen Pelvis W Contrast  Result Date: 05/05/2018 CLINICAL DATA:  Bowel obstruction, vomiting, fever. History of colon cancer. EXAM: CT ABDOMEN AND PELVIS WITH CONTRAST TECHNIQUE: Multidetector CT imaging of the abdomen and pelvis was performed using the standard protocol following bolus administration of intravenous contrast. CONTRAST:  165mL OMNIPAQUE IOHEXOL 300 MG/ML  SOLN COMPARISON:  None. FINDINGS: Lower chest: No acute abnormality. Hepatobiliary: Multiple hepatic hypodense masses most consistent with metastatic disease. Largest inferior right hepatic mass demonstrates small calcifications. Severely distended gallbladder with a 17 mm gallstone. No intrahepatic or extrahepatic biliary ductal dilatation. Pancreas: Unremarkable. No pancreatic ductal dilatation or surrounding inflammatory changes. Spleen: Normal in size without focal abnormality. Adrenals/Urinary Tract: Normal adrenal glands. Normal kidneys. Normal bladder. Stomach/Bowel: Fluid-filled loops of colon with mild mucosal enhancement. No pneumatosis, pneumoperitoneum or portal venous gas. No pericolonic inflammatory changes. Vascular/Lymphatic: No significant vascular findings are present. No enlarged abdominal or pelvic lymph nodes. Reproductive: Uterus is normal. 4.1 x 8.9 cm cystic mass between the uterus and rectum of uncertain etiology. Other: No fluid collection or hematoma. Musculoskeletal: No acute osseous abnormality. Sclerotic bone lesion in the right side of the sacrum, right posterior ilium most concerning for metastatic disease. IMPRESSION: 1. Fluid-filled loops of colon with mild mucosal enhancement which may reflect an ileus versus mild early colitis. 2. Multiple hepatic hypodense masses most consistent with metastatic disease. Sclerotic bone lesion in the right side of the sacrum, right  posterior ilium most concerning for metastatic disease. 3. 4.1 x 8.9  cm cystic mass between the uterus and rectum of uncertain etiology. This may reflect a cystic ovarian neoplasm versus pelvic inclusion cyst versus postoperative seroma. Electronically Signed   By: Kathreen Devoid   On: 05/05/2018 18:48   Dg Abd 2 Views  Result Date: 05/05/2018 CLINICAL DATA:  Vomiting and fever.  Colon carcinoma EXAM: ABDOMEN - 2 VIEW COMPARISON:  None. FINDINGS: Supine and upright images obtained. There are loops of dilated bowel in the mid abdomen. No air-fluid levels. No free air. No abnormal calcifications evident. Note that there is air in the rectum. There is sclerosis in the right sacral ala, likely metastatic in etiology. IMPRESSION: Loops of mildly dilated bowel without air-fluid levels. Question a degree of ileus or enteritis. Bowel obstruction felt to be less likely. No evident free air. Probable metastatic disease involving the right sacral ala. Electronically Signed   By: Lowella Grip III M.D.   On: 05/05/2018 18:05    ASSESSMENT & PLAN:   #62 year old female patient with metastatic colon cancer currently chemotherapy-admit to the hospital for neutropenic fever  #Metastatic colon cancer status post multiple lines of therapy-most recently irinotecan plus Avastin [day #9].   Too early to assess response; patient will need at least 3-4 cycles for response evaluation.  Defer to primary oncologist.   # Neutropenic fever-source is unclear patient positive for Klebsiella; /Streptococcus in blood patient currently on cefepime plus vancomycin.  Consider ID evaluation given the infection in the context of Mediport.  Discussed regarding Granix; growth factor support.  Patient will need to be considered for Neulasta with subsequent cycles of therapy.  # Mild diarrhea-secondary to irinotecan; if diarrhea continues recommend C. difficile testing/ GI PCR.  Thank you Dr.Kalisetti for allowing me to participate in  the care of your pleasant patient. Please do not hesitate to contact me with questions or concerns in the interim. Plan of care was discussed with patient and husband detail.  They agree.   All questions were answered. The patient knows to call the clinic with any problems, questions or concerns.    Cammie Sickle, MD 05/06/2018 9:30 PM

## 2018-05-06 NOTE — Progress Notes (Signed)
[place] [facility] [place] NAME: Leslie Wallace    MR#:  [id number]  DATE OF BIRTH:  1956-03-17  SUBJECTIVE:  CHIEF COMPLAINT:   Chief Complaint  Patient presents with  . Fever   Some diarrhea.  Fever last night and this morning.  Tachycardia of 110. REVIEW OF SYSTEMS:  Review of Systems  Constitutional: Positive for malaise/fatigue. Negative for chills and fever.  HENT: Negative for sore throat.   Eyes: Negative for blurred vision and double vision.  Respiratory: Negative for cough, hemoptysis, shortness of breath, wheezing and stridor.   Cardiovascular: Negative for chest pain, palpitations, orthopnea and leg swelling.  Gastrointestinal: Positive for diarrhea. Negative for abdominal pain, blood in stool, melena, nausea and vomiting.  Genitourinary: Negative for dysuria, flank pain and hematuria.  Musculoskeletal: Negative for back pain and joint pain.  Skin: Negative for rash.  Neurological: Negative for dizziness, sensory change, focal weakness, seizures, loss of consciousness, weakness and headaches.  Endo/Heme/Allergies: Negative for polydipsia.  Psychiatric/Behavioral: Negative for depression. The patient is not nervous/anxious.     DRUG ALLERGIES:  No Known Allergies VITALS:  Blood pressure 121/77, pulse (!) 110, temperature 99.1 F (37.3 C), temperature source Oral, resp. rate 20, height 5\' 4"  (1.626 m), weight 118 lb 6.4 oz (53.7 kg), SpO2 100 %. PHYSICAL EXAMINATION:  Physical Exam  Constitutional: She is oriented to person, place, and time.  Moderate malnutrition.  HENT:  Head: Normocephalic.  Mouth/Throat: Oropharynx is clear and moist.  Eyes: Pupils are equal, round, and reactive to light. Conjunctivae and EOM are normal. No scleral icterus.  Neck: Normal range of motion. Neck supple. No JVD present. No tracheal deviation present.  Cardiovascular: Normal rate, regular rhythm and normal heart sounds. Exam reveals no gallop.   No murmur heard. Pulmonary/Chest: Effort normal and breath sounds normal. No respiratory distress. She has no wheezes. She has no rales.  Abdominal: Soft. Bowel sounds are normal. She exhibits no distension. There is no tenderness. There is no rebound.  Musculoskeletal: Normal range of motion. She exhibits no edema or tenderness.  Neurological: She is alert and oriented to person, place, and time. No cranial nerve deficit.  Skin: No rash noted. No erythema.  Psychiatric: She has a normal mood and affect.   LABORATORY PANEL:  Female CBC Recent Labs  Lab 05/06/18 0524  WBC 0.5*  HGB 8.9*  HCT 26.8*  PLT 129*   ------------------------------------------------------------------------------------------------------------------ Chemistries  Recent Labs  Lab 05/05/18 1727 05/06/18 0524  NA 134* 136  K 3.7 3.6  CL 101 107  CO2 22 20*  GLUCOSE 163* 121*  BUN 19 20  CREATININE 0.92 0.87  CALCIUM 9.5 8.4*  AST 25  --   ALT 22  --   ALKPHOS 205*  --   BILITOT 2.7*  --    RADIOLOGY:  Dg Chest 2 View  Result Date: 05/05/2018 CLINICAL DATA:  Fever.  Colon carcinoma EXAM: CHEST - 2 VIEW COMPARISON:  None. FINDINGS: Port-A-Cath tip is at the cavoatrial junction. No pneumothorax. Lungs are clear. Heart size and pulmonary vascularity are normal. No adenopathy. There is evidence of old trauma involving the proximal left humerus. Bones overall appear osteoporotic. IMPRESSION: Port-A-Cath tip at cavoatrial junction. No pneumothorax. No edema or consolidation. No adenopathy. Old trauma proximal left humerus with healing. Electronically Signed   By: Lowella Grip III M.D.   On: 05/05/2018 18:03   Ct Abdomen Pelvis W Contrast  Result Date: 05/05/2018 CLINICAL DATA:  Bowel obstruction, vomiting, fever. History of colon cancer. EXAM: CT ABDOMEN AND PELVIS WITH CONTRAST TECHNIQUE: Multidetector CT imaging of the abdomen and pelvis was performed using the standard protocol following bolus  administration of intravenous contrast. CONTRAST:  152mL OMNIPAQUE IOHEXOL 300 MG/ML  SOLN COMPARISON:  None. FINDINGS: Lower chest: No acute abnormality. Hepatobiliary: Multiple hepatic hypodense masses most consistent with metastatic disease. Largest inferior right hepatic mass demonstrates small calcifications. Severely distended gallbladder with a 17 mm gallstone. No intrahepatic or extrahepatic biliary ductal dilatation. Pancreas: Unremarkable. No pancreatic ductal dilatation or surrounding inflammatory changes. Spleen: Normal in size without focal abnormality. Adrenals/Urinary Tract: Normal adrenal glands. Normal kidneys. Normal bladder. Stomach/Bowel: Fluid-filled loops of colon with mild mucosal enhancement. No pneumatosis, pneumoperitoneum or portal venous gas. No pericolonic inflammatory changes. Vascular/Lymphatic: No significant vascular findings are present. No enlarged abdominal or pelvic lymph nodes. Reproductive: Uterus is normal. 4.1 x 8.9 cm cystic mass between the uterus and rectum of uncertain etiology. Other: No fluid collection or hematoma. Musculoskeletal: No acute osseous abnormality. Sclerotic bone lesion in the right side of the sacrum, right posterior ilium most concerning for metastatic disease. IMPRESSION: 1. Fluid-filled loops of colon with mild mucosal enhancement which may reflect an ileus versus mild early colitis. 2. Multiple hepatic hypodense masses most consistent with metastatic disease. Sclerotic bone lesion in the right side of the sacrum, right posterior ilium most concerning for metastatic disease. 3. 4.1 x 8.9 cm cystic mass between the uterus and rectum of uncertain etiology. This may reflect a cystic ovarian neoplasm versus pelvic inclusion cyst versus postoperative seroma. Electronically Signed   By: Kathreen Devoid   On: 05/05/2018 18:48   Dg Abd 2 Views  Result Date: 05/05/2018 CLINICAL DATA:  Vomiting and fever.  Colon carcinoma EXAM: ABDOMEN - 2 VIEW COMPARISON:   None. FINDINGS: Supine and upright images obtained. There are loops of dilated bowel in the mid abdomen. No air-fluid levels. No free air. No abnormal calcifications evident. Note that there is air in the rectum. There is sclerosis in the right sacral ala, likely metastatic in etiology. IMPRESSION: Loops of mildly dilated bowel without air-fluid levels. Question a degree of ileus or enteritis. Bowel obstruction felt to be less likely. No evident free air. Probable metastatic disease involving the right sacral ala. Electronically Signed   By: Lowella Grip III M.D.   On: 05/05/2018 18:05   ASSESSMENT AND PLAN:   Leslie Wallace  is a 61 y.o. female with a known history of metastatic colon cancer diagnosed in 2017, with metastatic disease to liver and bones currently on chemotherapy, fibromyalgia, hypertension and irritable bowel syndrome who is visiting her mom here from [place] presents to hospital secondary to fevers.   1.  Febrile neutropenia-urine analysis is normal,  chest x-ray with no infiltrate. -CT of the abdomen showing may be early colitis. -Patient denies any specific GI symptoms.  Follow-up blood cultures Continue ancomycin and cefepime -Absolute neutrophil count is 51.  Follow-up CBC and oncologist.  Consider neulasta if needed  2.  Metastatic colon cancer-[place]. -Metastatic disease to liver and bones.  Currently on palliative chemotherapy with high-dose irinotecan Follow-up oncologist.  3.  Hypertension-continue Norvasc, lisinopril, Toprol  4.  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  Continue Lyrica. -Also on meloxicam for her fibromyalgia.  Moderate malnutrition. All the records are reviewed and case discussed with Care Management/Social Worker. Management plans discussed with the patient, her husband and they are in agreement.  CODE  STATUS: DNR  TOTAL TIME TAKING CARE OF THIS PATIENT: 38 minutes.   More  than 50% of the time was spent in counseling/coordination of care: YES  POSSIBLE D/C IN 2-3 DAYS, DEPENDING ON CLINICAL CONDITION.   Demetrios Loll M.D on 05/06/2018 at 1:07 PM  Between 7am to 6pm - Pager - [phone]  After 6pm go to [email or web address] - [facility]

## 2018-05-06 NOTE — Plan of Care (Signed)

## 2018-05-06 NOTE — Progress Notes (Signed)
PHARMACY - PHYSICIAN COMMUNICATION CRITICAL VALUE ALERT - BLOOD CULTURE IDENTIFICATION (BCID)  Leslie Wallace is an 62 y.o. female who presented to [facility] on 05/05/2018 with a chief complaint of fever. Patient from [place] w/ h/o colon carcinoma on high-dose chemo w/ irinotecan, started on empiric vanc/zosyn then switched to cefepime.  Assessment:  Tmax 102.5, tachycardic (110 - 120's), WBC 0.3, UA NG, CT abdomen possible ileus/colitis, patient diagnosed w/ neutropenic fever s/t colon carcinoma s/p chemo. 1/2 GPC and GNR BCID strep species, klebsiella pneumoniae KPC negative  Name of physician (or Provider) Contacted: Rosilyn Mings  Current antibiotics: Vanc/cefepime  Changes to prescribed antibiotics recommended:  Patient is on recommended antibiotics - No changes needed  Results for orders placed or performed during the hospital encounter of 05/05/18  Blood Culture ID Panel (Reflexed) (Collected: 05/05/2018  5:31 PM)  Result Value Ref Range   Enterococcus species NOT DETECTED NOT DETECTED   Listeria monocytogenes NOT DETECTED NOT DETECTED   Staphylococcus species NOT DETECTED NOT DETECTED   Staphylococcus aureus NOT DETECTED NOT DETECTED   Streptococcus species DETECTED (A) NOT DETECTED   Streptococcus agalactiae NOT DETECTED NOT DETECTED   Streptococcus pneumoniae NOT DETECTED NOT DETECTED   Streptococcus pyogenes NOT DETECTED NOT DETECTED   Acinetobacter baumannii NOT DETECTED NOT DETECTED   Enterobacteriaceae species DETECTED (A) NOT DETECTED   Enterobacter cloacae complex NOT DETECTED NOT DETECTED   Escherichia coli NOT DETECTED NOT DETECTED   Klebsiella oxytoca NOT DETECTED NOT DETECTED   Klebsiella pneumoniae DETECTED (A) NOT DETECTED   Proteus species NOT DETECTED NOT DETECTED   Serratia marcescens NOT DETECTED NOT DETECTED   Carbapenem resistance NOT DETECTED NOT DETECTED   Haemophilus influenzae NOT DETECTED NOT DETECTED   Neisseria meningitidis NOT DETECTED NOT  DETECTED   Pseudomonas aeruginosa NOT DETECTED NOT DETECTED   Candida albicans NOT DETECTED NOT DETECTED   Candida glabrata NOT DETECTED NOT DETECTED   Candida krusei NOT DETECTED NOT DETECTED   Candida parapsilosis NOT DETECTED NOT DETECTED   Candida tropicalis NOT DETECTED NOT DETECTED   Tobie Lords, PharmD, BCPS Clinical Pharmacist 05/06/2018

## 2018-05-07 ENCOUNTER — Encounter: Payer: Self-pay | Admitting: Internal Medicine

## 2018-05-07 LAB — URINE CULTURE

## 2018-05-07 LAB — CBC
HEMATOCRIT: 24.7 % — AB (ref 35.0–47.0)
Hemoglobin: 8.2 g/dL — ABNORMAL LOW (ref 12.0–16.0)
MCH: 28.5 pg (ref 26.0–34.0)
MCHC: 33.1 g/dL (ref 32.0–36.0)
MCV: 86.2 fL (ref 80.0–100.0)
PLATELETS: 124 10*3/uL — AB (ref 150–440)
RBC: 2.86 MIL/uL — ABNORMAL LOW (ref 3.80–5.20)
RDW: 19.3 % — AB (ref 11.5–14.5)
WBC: 0.6 10*3/uL — AB (ref 3.6–11.0)

## 2018-05-07 LAB — CBC WITH DIFFERENTIAL/PLATELET
Basophils Absolute: 0 10*3/uL (ref 0–0.1)
Basophils Relative: 1 %
EOS PCT: 4 %
Eosinophils Absolute: 0 10*3/uL (ref 0–0.7)
HEMATOCRIT: 25 % — AB (ref 35.0–47.0)
Hemoglobin: 8.2 g/dL — ABNORMAL LOW (ref 12.0–16.0)
Lymphocytes Relative: 56 %
Lymphs Abs: 0.4 10*3/uL — ABNORMAL LOW (ref 1.0–3.6)
MCH: 28.4 pg (ref 26.0–34.0)
MCHC: 32.7 g/dL (ref 32.0–36.0)
MCV: 86.8 fL (ref 80.0–100.0)
MONOS PCT: 28 %
Monocytes Absolute: 0.2 10*3/uL (ref 0.2–0.9)
NEUTROS PCT: 11 %
Neutro Abs: 0.1 10*3/uL — ABNORMAL LOW (ref 1.4–6.5)
Platelets: 139 10*3/uL — ABNORMAL LOW (ref 150–440)
RBC: 2.88 MIL/uL — AB (ref 3.80–5.20)
RDW: 18.9 % — ABNORMAL HIGH (ref 11.5–14.5)
WBC: 0.7 10*3/uL — AB (ref 3.6–11.0)

## 2018-05-07 LAB — HIV ANTIBODY (ROUTINE TESTING W REFLEX): HIV Screen 4th Generation wRfx: NONREACTIVE

## 2018-05-07 MED ORDER — DICYCLOMINE HCL 10 MG PO CAPS
10.0000 mg | ORAL_CAPSULE | Freq: Three times a day (TID) | ORAL | Status: DC
  Administered 2018-05-07 – 2018-05-09 (×8): 10 mg via ORAL
  Filled 2018-05-07 (×10): qty 1

## 2018-05-07 MED ORDER — SODIUM CHLORIDE 0.9% FLUSH
10.0000 mL | INTRAVENOUS | Status: DC | PRN
  Administered 2018-05-07 – 2018-05-09 (×4): 10 mL via INTRAVENOUS
  Filled 2018-05-07 (×3): qty 10

## 2018-05-07 MED ORDER — FAMOTIDINE 20 MG PO TABS
20.0000 mg | ORAL_TABLET | Freq: Two times a day (BID) | ORAL | Status: DC
  Administered 2018-05-08 – 2018-05-09 (×4): 20 mg via ORAL
  Filled 2018-05-07 (×4): qty 1

## 2018-05-07 MED ORDER — SODIUM CHLORIDE 0.9% FLUSH
10.0000 mL | INTRAVENOUS | Status: DC | PRN
  Administered 2018-05-09: 11:00:00 10 mL
  Filled 2018-05-07: qty 40

## 2018-05-07 MED ORDER — LISINOPRIL 20 MG PO TABS
20.0000 mg | ORAL_TABLET | Freq: Every day | ORAL | Status: DC
  Administered 2018-05-08: 20 mg via ORAL
  Filled 2018-05-07: qty 1

## 2018-05-07 MED ORDER — SODIUM CHLORIDE 0.9% FLUSH
10.0000 mL | Freq: Two times a day (BID) | INTRAVENOUS | Status: DC
  Administered 2018-05-08 – 2018-05-09 (×2): 10 mL

## 2018-05-07 MED ORDER — SODIUM CHLORIDE 0.9% FLUSH
3.0000 mL | Freq: Two times a day (BID) | INTRAVENOUS | Status: DC
  Administered 2018-05-07 – 2018-05-09 (×4): 3 mL via INTRAVENOUS

## 2018-05-07 NOTE — Progress Notes (Signed)
Hi   This is Dr Bettey Costa. Your patient is visiting [place] and is in our hospital with neutrapenic fevers.  Her blood cultures are growing Strep and Klebsiella. We are awaiting final sensitivities. The culture from the post is negative. We are planning for 2 weeks of IC ABX from negative blood cx results through the port then she will need repeat blood cx to assure clearing. She is planning to go back to [place] for her chemo, but we need to arrange IV ABx.  Can you call me at [phone] tomorrow when you have a chance so we can touch base. I hope I will have the sensitivities back.  Thank you for your time and Happy 4th  Dr. Ulice Bold Allisa Einspahr

## 2018-05-07 NOTE — Progress Notes (Signed)
Reports feeling some better today. Ate biscuit from outside. Reports no diarrhea thus far this shift. Reports back pain 2/10 with no interventions desired. WBC 0.7 rising. Husband in at long intervals. Protective isolation measures continued.

## 2018-05-07 NOTE — Progress Notes (Signed)
Leslie Wallace   DOB:1956-03-07   HY#:[id number]    Subjective: Patient feels improved today.  No fevers in the last 24 hours.  No nausea no vomiting.  Having approximately 1 loose stool a day.  No abdominal cramping.    Objective:  Vitals:   05/07/18 0413 05/07/18 0842  BP: 113/62 (!) 119/99  Pulse: 95 (!) 103  Resp: 19 20  Temp: 98.5 F (36.9 C) 99.8 F (37.7 C)  SpO2: 97% 100%     Intake/Output Summary (Last 24 hours) at 05/07/2018 1023 Last data filed at 05/07/2018 0408 Gross per 24 hour  Intake 2548.33 ml  Output -  Net 8786.33 ml    GENERAL Alert, no distress and comfortable.  EYES: no pallor or icterus OROPHARYNX: no thrush or ulceration. NECK: supple, no masses felt LYMPH:  no palpable lymphadenopathy in the cervical, axillary or inguinal regions LUNGS: decreased breath sounds to auscultation at bases and  No wheeze or crackles HEART/CVS: regular rate & rhythm and no murmurs; No lower extremity edema ABDOMEN: abdomen soft, tender  on deep palpation. and normal bowel sounds Musculoskeletal:no cyanosis of digits and no clubbing  PSYCH: alert & oriented x 3 with fluent speech NEURO: no focal motor/sensory deficits SKIN:  no rashes or significant lesions   Labs:  Lab Results  Component Value Date   WBC 0.7 (LL) 05/07/2018   HGB 8.2 (L) 05/07/2018   HCT 25.0 (L) 05/07/2018   MCV 86.8 05/07/2018   PLT 139 (L) 05/07/2018   NEUTROABS 0.1 (L) 05/07/2018    Lab Results  Component Value Date   NA 136 05/06/2018   K 3.6 05/06/2018   CL 107 05/06/2018   CO2 20 (L) 05/06/2018    Studies:  Dg Chest 2 View  Result Date: 05/05/2018 CLINICAL DATA:  Fever.  Colon carcinoma EXAM: CHEST - 2 VIEW COMPARISON:  None. FINDINGS: Port-A-Cath tip is at the cavoatrial junction. No pneumothorax. Lungs are clear. Heart size and pulmonary vascularity are normal. No adenopathy. There is evidence of old trauma involving the proximal left humerus. Bones overall appear osteoporotic.  IMPRESSION: Port-A-Cath tip at cavoatrial junction. No pneumothorax. No edema or consolidation. No adenopathy. Old trauma proximal left humerus with healing. Electronically Signed   By: Lowella Grip III M.D.   On: 05/05/2018 18:03   Ct Abdomen Pelvis W Contrast  Result Date: 05/05/2018 CLINICAL DATA:  Bowel obstruction, vomiting, fever. History of colon cancer. EXAM: CT ABDOMEN AND PELVIS WITH CONTRAST TECHNIQUE: Multidetector CT imaging of the abdomen and pelvis was performed using the standard protocol following bolus administration of intravenous contrast. CONTRAST:  13mL OMNIPAQUE IOHEXOL 300 MG/ML  SOLN COMPARISON:  None. FINDINGS: Lower chest: No acute abnormality. Hepatobiliary: Multiple hepatic hypodense masses most consistent with metastatic disease. Largest inferior right hepatic mass demonstrates small calcifications. Severely distended gallbladder with a 17 mm gallstone. No intrahepatic or extrahepatic biliary ductal dilatation. Pancreas: Unremarkable. No pancreatic ductal dilatation or surrounding inflammatory changes. Spleen: Normal in size without focal abnormality. Adrenals/Urinary Tract: Normal adrenal glands. Normal kidneys. Normal bladder. Stomach/Bowel: Fluid-filled loops of colon with mild mucosal enhancement. No pneumatosis, pneumoperitoneum or portal venous gas. No pericolonic inflammatory changes. Vascular/Lymphatic: No significant vascular findings are present. No enlarged abdominal or pelvic lymph nodes. Reproductive: Uterus is normal. 4.1 x 8.9 cm cystic mass between the uterus and rectum of uncertain etiology. Other: No fluid collection or hematoma. Musculoskeletal: No acute osseous abnormality. Sclerotic bone lesion in the right side of the sacrum, right posterior ilium most concerning  for metastatic disease. IMPRESSION: 1. Fluid-filled loops of colon with mild mucosal enhancement which may reflect an ileus versus mild early colitis. 2. Multiple hepatic hypodense masses most  consistent with metastatic disease. Sclerotic bone lesion in the right side of the sacrum, right posterior ilium most concerning for metastatic disease. 3. 4.1 x 8.9 cm cystic mass between the uterus and rectum of uncertain etiology. This may reflect a cystic ovarian neoplasm versus pelvic inclusion cyst versus postoperative seroma. Electronically Signed   By: Kathreen Devoid   On: 05/05/2018 18:48   Dg Abd 2 Views  Result Date: 05/05/2018 CLINICAL DATA:  Vomiting and fever.  Colon carcinoma EXAM: ABDOMEN - 2 VIEW COMPARISON:  None. FINDINGS: Supine and upright images obtained. There are loops of dilated bowel in the mid abdomen. No air-fluid levels. No free air. No abnormal calcifications evident. Note that there is air in the rectum. There is sclerosis in the right sacral ala, likely metastatic in etiology. IMPRESSION: Loops of mildly dilated bowel without air-fluid levels. Question a degree of ileus or enteritis. Bowel obstruction felt to be less likely. No evident free air. Probable metastatic disease involving the right sacral ala. Electronically Signed   By: Lowella Grip III M.D.   On: 05/05/2018 18:05    Assessment & Plan:   #62 year old female patient with metastatic colon cancer on chemotherapy currently admitted to hospital for neutropenic fever  # Neutropenic fever/bacteremia/sepsis-clinically improving ; continue broad-spectrum antibiotics.  Currently on  Granix/ White count slightly improved continue Granix for now.  Given the Mediport-concern of seeding.  Will need repeat blood cultures; if still positive ID  consultation would be reasonable.  # Metastatic colon cancer-recent progression noted status post cycle #1 of; irinotecan-Avastin; day #10 today.  # Diarrhea-grade 1-improving likely secondary to irinotecan; not suspicious for  C.diff or infection  The above plan of care was discussed the patient and family detail.  They agree.     Cammie Sickle, MD 05/07/2018  10:23  AM

## 2018-05-07 NOTE — Progress Notes (Signed)
[place] [facility] [place] NAME: Leslie Wallace    MR#:  [id number]  DATE OF BIRTH:  1956-08-08  SUBJECTIVE:   patient doing ok this am wants to go home  REVIEW OF SYSTEMS:    Review of Systems  Constitutional: Negative for fever, chills weight loss HENT: Negative for ear pain, nosebleeds, congestion, facial swelling, rhinorrhea, neck pain, neck stiffness and ear discharge.   Respiratory: Negative for cough, shortness of breath, wheezing  Cardiovascular: Negative for chest pain, palpitations and leg swelling.  Gastrointestinal: Negative for heartburn, abdominal pain, vomiting, diarrhea or consitpation Genitourinary: Negative for dysuria, urgency, frequency, hematuria Musculoskeletal: Negative for back pain or joint pain Neurological: Negative for dizziness, seizures, syncope, focal weakness,  numbness and headaches.  Hematological: Does not bruise/bleed easily.  Psychiatric/Behavioral: Negative for hallucinations, confusion, dysphoric mood    Tolerating Diet: yes      DRUG ALLERGIES:  No Known Allergies  VITALS:  Blood pressure (!) 119/99, pulse (!) 103, temperature 99.8 F (37.7 C), temperature source Oral, resp. rate 20, height 5\' 4"  (1.626 m), weight 53.7 kg (118 lb 6.4 oz), SpO2 100 %.  PHYSICAL EXAMINATION:  Constitutional: Appears well-developed and well-nourished. No distress. HENT: Normocephalic. Marland Kitchen Oropharynx is clear and moist.  Eyes: Conjunctivae and EOM are normal. PERRLA, no scleral icterus.  Neck: Normal ROM. Neck supple. No JVD. No tracheal deviation. CVS: RRR, S1/S2 +, no murmurs, no gallops, no carotid bruit.  Pulmonary: Effort and breath sounds normal, no stridor, rhonchi, wheezes, rales.  Abdominal: Soft. BS +,  no distension, tenderness, rebound or guarding.  Musculoskeletal: Normal range of motion. No edema and no tenderness.  Neuro: Alert. CN 2-12 grossly intact. No focal deficits. Skin: Skin is warm and dry. No  rash noted. Psychiatric: Normal mood and affect.      LABORATORY PANEL:   CBC Recent Labs  Lab 05/07/18 0824  WBC 0.7*  HGB 8.2*  HCT 25.0*  PLT 139*   ------------------------------------------------------------------------------------------------------------------  Chemistries  Recent Labs  Lab 05/05/18 1727 05/06/18 0524  NA 134* 136  K 3.7 3.6  CL 101 107  CO2 22 20*  GLUCOSE 163* 121*  BUN 19 20  CREATININE 0.92 0.87  CALCIUM 9.5 8.4*  AST 25  --   ALT 22  --   ALKPHOS 205*  --   BILITOT 2.7*  --    ------------------------------------------------------------------------------------------------------------------  Cardiac Enzymes No results for input(s): TROPONINI in the last 168 hours. ------------------------------------------------------------------------------------------------------------------  RADIOLOGY:  Dg Chest 2 View  Result Date: 05/05/2018 CLINICAL DATA:  Fever.  Colon carcinoma EXAM: CHEST - 2 VIEW COMPARISON:  None. FINDINGS: Port-A-Cath tip is at the cavoatrial junction. No pneumothorax. Lungs are clear. Heart size and pulmonary vascularity are normal. No adenopathy. There is evidence of old trauma involving the proximal left humerus. Bones overall appear osteoporotic. IMPRESSION: Port-A-Cath tip at cavoatrial junction. No pneumothorax. No edema or consolidation. No adenopathy. Old trauma proximal left humerus with healing. Electronically Signed   By: Lowella Grip III M.D.   On: 05/05/2018 18:03   Ct Abdomen Pelvis W Contrast  Result Date: 05/05/2018 CLINICAL DATA:  Bowel obstruction, vomiting, fever. History of colon cancer. EXAM: CT ABDOMEN AND PELVIS WITH CONTRAST TECHNIQUE: Multidetector CT imaging of the abdomen and pelvis was performed using the standard protocol following bolus administration of intravenous contrast. CONTRAST:  11mL OMNIPAQUE IOHEXOL 300 MG/ML  SOLN COMPARISON:  None. FINDINGS: Lower chest: No acute abnormality.  Hepatobiliary: Multiple hepatic hypodense masses most  consistent with metastatic disease. Largest inferior right hepatic mass demonstrates small calcifications. Severely distended gallbladder with a 17 mm gallstone. No intrahepatic or extrahepatic biliary ductal dilatation. Pancreas: Unremarkable. No pancreatic ductal dilatation or surrounding inflammatory changes. Spleen: Normal in size without focal abnormality. Adrenals/Urinary Tract: Normal adrenal glands. Normal kidneys. Normal bladder. Stomach/Bowel: Fluid-filled loops of colon with mild mucosal enhancement. No pneumatosis, pneumoperitoneum or portal venous gas. No pericolonic inflammatory changes. Vascular/Lymphatic: No significant vascular findings are present. No enlarged abdominal or pelvic lymph nodes. Reproductive: Uterus is normal. 4.1 x 8.9 cm cystic mass between the uterus and rectum of uncertain etiology. Other: No fluid collection or hematoma. Musculoskeletal: No acute osseous abnormality. Sclerotic bone lesion in the right side of the sacrum, right posterior ilium most concerning for metastatic disease. IMPRESSION: 1. Fluid-filled loops of colon with mild mucosal enhancement which may reflect an ileus versus mild early colitis. 2. Multiple hepatic hypodense masses most consistent with metastatic disease. Sclerotic bone lesion in the right side of the sacrum, right posterior ilium most concerning for metastatic disease. 3. 4.1 x 8.9 cm cystic mass between the uterus and rectum of uncertain etiology. This may reflect a cystic ovarian neoplasm versus pelvic inclusion cyst versus postoperative seroma. Electronically Signed   By: Kathreen Devoid   On: 05/05/2018 18:48   Dg Abd 2 Views  Result Date: 05/05/2018 CLINICAL DATA:  Vomiting and fever.  Colon carcinoma EXAM: ABDOMEN - 2 VIEW COMPARISON:  None. FINDINGS: Supine and upright images obtained. There are loops of dilated bowel in the mid abdomen. No air-fluid levels. No free air. No abnormal  calcifications evident. Note that there is air in the rectum. There is sclerosis in the right sacral ala, likely metastatic in etiology. IMPRESSION: Loops of mildly dilated bowel without air-fluid levels. Question a degree of ileus or enteritis. Bowel obstruction felt to be less likely. No evident free air. Probable metastatic disease involving the right sacral ala. Electronically Signed   By: Lowella Grip III M.D.   On: 05/05/2018 18:05     ASSESSMENT AND PLAN:   62 year old female with history of metastatic colon cancer on chemotherapy who presented to the ED with fever.  1.  Sepsis with neutropenic fever and bacteremia: We will consult ID given bacteremia positive for Streptococcus and Klebsiella Continue Granix Continue Rocephin Follow-up on sensitivities Repeat blood cultures  2.  Metastatic colon cancer: Appreciate oncology evaluation.  3.  Diarrhea which is improved  4.  Essential hypertension: Continue Norvasc, lisinopril and metoprolol  5.  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  CODE STATUS: DNR  TOTAL TIME TAKING CARE OF THIS PATIENT: 27 minutes.     POSSIBLE D/C 1-2 days, DEPENDING ON CLINICAL CONDITION.   Wendy Mikles M.D on 05/07/2018 at 11:59 AM  Between 7am to 6pm - Pager - [phone] After 6pm go to [email or web address] - password [email or web address] [facility]  Office  [phone]  CC: Primary care physician; System, Pcp Not In  Note: This dictation was prepared with Dragon dictation along with smaller phrase technology. Any transcriptional errors that result from this process are unintentional.

## 2018-05-07 NOTE — Progress Notes (Signed)
PT Cancellation Note  Patient Details Name: Leslie Wallace MRN: [id number] DOB: 1955-11-06   Cancelled Treatment:    Reason Eval/Treat Not Completed: Other (comment)(Patient unable to rouse)  Attempted to see patient 3x, patient sleeping upon arrival, unable to rouse at this time. Will attempt again at a later time/date.   Janna Arch, PT, DPT   05/07/2018, 2:54 PM

## 2018-05-08 NOTE — Progress Notes (Signed)
[facility] Address discharging: [address], [place] [address]  Phone: [phone] (cell)  PCP: Dr. Charleen Kirks ([place])  If patient discharges after hours, please call [phone].   Leslie Wallace 05/08/2018, 10:39 AM

## 2018-05-08 NOTE — Progress Notes (Signed)
Pt refused breakfast and states she does not want lunch. States her husband will bring her something to eat "when I figure out what I want". Pt denies pain or further needs. Educated to call RN for any further needs. Verbalizes understanding.

## 2018-05-08 NOTE — Progress Notes (Signed)
Leslie Wallace   DOB:07-14-1956   KG#:[id number]    Subjective: No fevers today.  Appetite improving.  1-2 loose stools.  Otherwise no abdominal cramping.    Objective:  Vitals:   05/08/18 1613 05/08/18 1923  BP: 117/79 113/72  Pulse: 86 83  Resp: 20 20  Temp: 98.6 F (37 C) 98.4 F (36.9 C)  SpO2: 100% 100%     Intake/Output Summary (Last 24 hours) at 05/08/2018 1944 Last data filed at 05/08/2018 0803 Gross per 24 hour  Intake 10 ml  Output -  Net 10 ml    GENERAL Alert, no distress and comfortable.  EYES: no pallor or icterus OROPHARYNX: no thrush or ulceration. NECK: supple, no masses felt LYMPH:  no palpable lymphadenopathy in the cervical, axillary or inguinal regions LUNGS: decreased breath sounds to auscultation at bases and  No wheeze or crackles HEART/CVS: regular rate & rhythm and no murmurs; No lower extremity edema ABDOMEN: abdomen soft, tender  on deep palpation. and normal bowel sounds Musculoskeletal:no cyanosis of digits and no clubbing  PSYCH: alert & oriented x 3 with fluent speech NEURO: no focal motor/sensory deficits SKIN:  no rashes or significant lesions   Labs:  Lab Results  Component Value Date   WBC 0.7 (LL) 05/07/2018   HGB 8.2 (L) 05/07/2018   HCT 25.0 (L) 05/07/2018   MCV 86.8 05/07/2018   PLT 139 (L) 05/07/2018   NEUTROABS 0.1 (L) 05/07/2018    Lab Results  Component Value Date   NA 136 05/06/2018   K 3.6 05/06/2018   CL 107 05/06/2018   CO2 20 (L) 05/06/2018    Studies:  No results found.  Assessment & Plan:   #62 year old female patient with metastatic colon cancer on chemotherapy currently admitted to hospital for neutropenic fever  # Neutropenic fever/bacteremia/sepsis-clinically improving.  Continue broad-spectrum antibiotics.  Currently on  Granix/ White count slightly improved continue Granix; at least until A1c greater than 1000.  As per ID patient needs 2 weeks of IV antibiotics through the port.  Sensitivities pending.   This has to be coordinated with home health/and the fact that [place].  At the time of discharge we will coordinate with patient's primary oncologist office in [place].  # Metastatic colon cancer-recent progression noted status post cycle #1 of; irinotecan-Avastin; day #1 11.  # Diarrhea-grade 1-improving likely secondary to irinotecan-improved.  Above plan of care was discussed with the patient and family detail.  Also discussed with Dr. Genia Harold.  #Dr. Janese Banks on call over the weekend.   Cammie Sickle, MD 05/08/2018  7:44 PM

## 2018-05-08 NOTE — Progress Notes (Signed)
PT Cancellation Note  Patient Details Name: Leslie Wallace MRN: [id number] DOB: [birth year]   Cancelled Treatment:    Reason Eval/Treat Not Completed: Patient declined, no reason specified.  Pt refusing physical therapy and stating that she did not need physical therapy (pt also reports walking independently in her room).  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  Will discontinue current PT order (nurse and CM notified).  Please re-consult PT if acute PT needs are identified and pt is agreeable to physical therapy.  Leitha Bleak, PT 05/08/18, 2:18 PM [phone]

## 2018-05-08 NOTE — Progress Notes (Signed)
Per PT tech, pt refused physical therapy stating she did not need it. Pt aware she can ask for PT again if she needs it. Pt denies further needs.

## 2018-05-08 NOTE — Progress Notes (Signed)
[place] at [place] NAME: Leslie Wallace    MR#:  [id number]  DATE OF BIRTH:  Apr 08, 1956  SUBJECTIVE:    No acute events overnight  REVIEW OF SYSTEMS:    Review of Systems  Constitutional: Negative for fever, chills weight loss HENT: Negative for ear pain, nosebleeds, congestion, facial swelling, rhinorrhea, neck pain, neck stiffness and ear discharge.   Respiratory: Negative for cough, shortness of breath, wheezing  Cardiovascular: Negative for chest pain, palpitations and leg swelling.  Gastrointestinal: Negative for heartburn, abdominal pain, vomiting, diarrhea or consitpation Genitourinary: Negative for dysuria, urgency, frequency, hematuria Musculoskeletal: Negative for back pain or joint pain Neurological: Negative for dizziness, seizures, syncope, focal weakness,  numbness and headaches.  Hematological: Does not bruise/bleed easily.  Psychiatric/Behavioral: Negative for hallucinations, confusion, dysphoric mood    Tolerating Diet: yes      DRUG ALLERGIES:  No Known Allergies  VITALS:  Blood pressure 121/70, pulse (!) 101, temperature 99.4 F (37.4 C), temperature source Oral, resp. rate 18, height 5\' 4"  (1.626 m), weight 53.7 kg (118 lb 6.4 oz), SpO2 100 %.  PHYSICAL EXAMINATION:  Constitutional: Appears well-developed and well-nourished. No distress. HENT: Normocephalic. Marland Kitchen Oropharynx is clear and moist.  Eyes: Conjunctivae and EOM are normal. PERRLA, no scleral icterus.  Neck: Normal ROM. Neck supple. No JVD. No tracheal deviation. CVS: RRR, S1/S2 +, no murmurs, no gallops, no carotid bruit.  Pulmonary: Effort and breath sounds normal, no stridor, rhonchi, wheezes, rales.  Abdominal: Soft. BS +,  no distension, tenderness, rebound or guarding.  Musculoskeletal: Normal range of motion. No edema and no tenderness.  Neuro: Alert. CN 2-12 grossly intact. No focal deficits. Skin: Skin is warm and dry. No rash  noted. Psychiatric: Normal mood and affect.      LABORATORY PANEL:   CBC Recent Labs  Lab 05/07/18 0824  WBC 0.7*  HGB 8.2*  HCT 25.0*  PLT 139*   ------------------------------------------------------------------------------------------------------------------  Chemistries  Recent Labs  Lab 05/05/18 1727 05/06/18 0524  NA 134* 136  K 3.7 3.6  CL 101 107  CO2 22 20*  GLUCOSE 163* 121*  BUN 19 20  CREATININE 0.92 0.87  CALCIUM 9.5 8.4*  AST 25  --   ALT 22  --   ALKPHOS 205*  --   BILITOT 2.7*  --    ------------------------------------------------------------------------------------------------------------------  Cardiac Enzymes No results for input(s): TROPONINI in the last 168 hours. ------------------------------------------------------------------------------------------------------------------  RADIOLOGY:  No results found.   ASSESSMENT AND PLAN:   62 year old female with history of metastatic colon cancer on chemotherapy who presented to the ED with fever.  1.  Sepsis with neutropenic fever and Klebsiella bacteremia: As per ID treat with IV antibiotics through port x2 weeks.  Day 1 should be when repeat blood cultures are negative. Case management assisting with home health for 2 weeks.  Patient will stay in [place] to receive antibiotics. She will then need to have repeat cultures drawn from the port when she goes back to [place]. Continue Granix Continue Rocephin Sensitivities will come out tomorrow as per lab   2.  Metastatic colon cancer: Appreciate oncology evaluation. She will follow-up with her oncologist in [place].   3.  Diarrhea which is improved: Streptococcus from [facility] ID is from diarrhea which is related to her chemotherapy.  4.  Essential hypertension: Continue Norvasc, lisinopril and metoprolol  5.  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 Jacinto Reap and case manager  Management  plans discussed  with the patient and she is in agreement.  CODE STATUS: DNR  TOTAL TIME TAKING CARE OF THIS PATIENT: 28 minutes.     POSSIBLE D/C tomorrow DEPENDING ON sensitivities  Lew Prout M.D on 05/08/2018 at 10:56 AM  Between 7am to 6pm - Pager - [phone] After 6pm go to [email or web address] - password [email or web address] [facility]  Office  [phone]  CC: Primary care physician; System, Pcp Not In  Note: This dictation was prepared with Dragon dictation along with smaller phrase technology. Any transcriptional errors that result from this process are unintentional.

## 2018-05-08 NOTE — Care Management Note (Signed)
Case Management Note  Patient Details  Name: Leslie Wallace MRN: [id number] Date of Birth: [birth year]  Subjective/Objective:   Ad[facility] to [facility] with the diagnosis of neutropenic fever.Colon cancer with mets to the bone/liver.  Lives with husband, [name] [phone]) in [place].   Visiting [place] to get [name]'s mother ready to sell.  Mother had to be placed in nursing home. Sees Dr. Candise Che at home.  Port placed 2 years ago.    Dr Benjie Karvonen discussed with [name] the infusion of 2 weeks antibiotics here in [place] before returning to [place] if sensitivites come back positive            Action/Plan: Will continue to follow for plans.   Expected Discharge Date:                  Expected Discharge Plan:     In-House Referral:     Discharge planning Services     Post Acute Care Choice:    Choice offered to:     DME Arranged:    DME Agency:     HH Arranged:    HH Agency:     Status of Service:     If discussed at [employer], dates discussed:    Additional Comments:  Shelbie Ammons, RN MSN CCM Care Management [phone] 05/08/2018, 9:37 AM

## 2018-05-09 LAB — CBC
HEMATOCRIT: 22 % — AB (ref 35.0–47.0)
Hemoglobin: 7.3 g/dL — ABNORMAL LOW (ref 12.0–16.0)
MCH: 28.6 pg (ref 26.0–34.0)
MCHC: 33.2 g/dL (ref 32.0–36.0)
MCV: 86 fL (ref 80.0–100.0)
PLATELETS: 203 10*3/uL (ref 150–440)
RBC: 2.55 MIL/uL — ABNORMAL LOW (ref 3.80–5.20)
RDW: 18.6 % — ABNORMAL HIGH (ref 11.5–14.5)
WBC: 3.7 10*3/uL (ref 3.6–11.0)

## 2018-05-09 LAB — CULTURE, BLOOD (SINGLE)

## 2018-05-09 LAB — PREPARE RBC (CROSSMATCH)

## 2018-05-09 LAB — ABO/RH: ABO/RH(D): A POS

## 2018-05-09 LAB — HEMOGLOBIN AND HEMATOCRIT, BLOOD
HCT: 26.2 % — ABNORMAL LOW (ref 35.0–47.0)
Hemoglobin: 8.9 g/dL — ABNORMAL LOW (ref 12.0–16.0)

## 2018-05-09 MED ORDER — SODIUM CHLORIDE 0.9% IV SOLUTION
Freq: Once | INTRAVENOUS | Status: AC
  Administered 2018-05-09: 13:00:00 via INTRAVENOUS

## 2018-05-09 MED ORDER — HEPARIN SOD (PORK) LOCK FLUSH 100 UNIT/ML IV SOLN
500.0000 [IU] | INTRAVENOUS | Status: AC | PRN
  Administered 2018-05-09: 19:00:00 500 [IU]
  Filled 2018-05-09: qty 5

## 2018-05-09 MED ORDER — ACETAMINOPHEN 325 MG PO TABS
650.0000 mg | ORAL_TABLET | Freq: Once | ORAL | Status: AC
  Administered 2018-05-09: 650 mg via ORAL
  Filled 2018-05-09: qty 2

## 2018-05-09 MED ORDER — SODIUM CHLORIDE 0.9 % IV SOLN: 2.0000 g | Freq: Every day | INTRAVENOUS | 0 refills | Status: DC

## 2018-05-09 NOTE — Progress Notes (Signed)
1 unit PRBC's transfused with pt tolerating well. Recheck HGB obtained and sent to lab. Preparing oral and written AVS instructions with rx for rocephin given with [facility] referral in place. Pt desires port remains accessed for use tomorrow. Will await hgb report before discharge home with husband to self care.

## 2018-05-09 NOTE — Discharge Summary (Signed)
[place] [facility] [place] NAME: Leslie Wallace    MR#:  [id number]  DATE OF BIRTH:  06-Aug-1956  DATE OF ADMISSION:  05/05/2018   ADMITTING PHYSICIAN: Gladstone Lighter, MD  DATE OF DISCHARGE: 05/09/2018  PRIMARY CARE PHYSICIAN: System, Pcp Not In   ADMISSION DIAGNOSIS:  Vomiting [R11.10] Neutropenic fever ([place]) [D70.9, R50.81] DISCHARGE DIAGNOSIS:  Active Problems:   Neutropenic fever ([place])  SECONDARY DIAGNOSIS:   Past Medical History:  Diagnosis Date  . 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 Fibromyalgia   . History of IBS   . Hypertension    HOSPITAL COURSE:   62 year old female with history of metastatic colon cancer on chemotherapy who presented to the ED with fever.  1.  Sepsis with neutropenic fever and Klebsiella bacteremia: As per ID treat with IV antibiotics through port x2 weeks.  Day 1 should be when repeat blood cultures are negative.  Repeated culture is negative for 2 days. Case management assisting with home health for 2 weeks.  Patient will stay in [place] to receive antibiotics. She will then need to have repeat cultures drawn from the port when she goes back to [place]. Continue Granix Continue Rocephin for total 2 weeks. Sensitivities: Sensitive to Rocephin.  Neutropenia improved.   2.  Metastatic colon cancer: Appreciate oncology evaluation. She will follow-up with her oncologist in [place].  3.  Diarrhea which is improved: Streptococcus from [facility] ID is from diarrhea which is related to her chemotherapy.  4.  Essential hypertension: Continue Norvasc, lisinopril and metoprolol  5.  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 Hemoglobin decreased to 7.3, possible due to chemotherapy.  1 unit PRBC transfusion andfollow-up hemoglobin.  DISCHARGE CONDITIONS:  Stable, discharge to home today. CONSULTS OBTAINED:  Treatment Team:  Cammie Sickle, MD DRUG ALLERGIES:  No Known Allergies DISCHARGE MEDICATIONS:   Allergies as of 05/09/2018   No Known Allergies     Medication List    STOP taking these medications   XGEVA 120 MG/1.7ML Soln injection Generic drug:  denosumab     TAKE these medications   amLODipine 10 MG tablet Commonly known as:  NORVASC Take 10 mg by mouth daily.   B-12 1000 MCG/ML Kit Inject 1,000 mcg as directed every 28 (twenty-eight) days.   cefTRIAXone 2 g in sodium chloride 0.9 % 100 mL Inject 2 g into the vein daily at 6 PM.   dexamethasone 4 MG tablet Commonly known as:  DECADRON Take 8 mg by mouth every evening. On days 2 and 3 of each cycle   dicyclomine 10 MG capsule Commonly known as:  BENTYL Take 10 mg by mouth 4 (four) times daily -  before meals and at bedtime.   famotidine 20 MG tablet Commonly known as:  PEPCID Take 20 mg by mouth 2 (two) times daily.   LINZESS 145 MCG Caps capsule Generic drug:  linaclotide Take 145 mg by mouth daily before breakfast.   lisinopril 20 MG tablet Commonly known as:  PRINIVIL,ZESTRIL Take 20 mg by mouth at bedtime.   loperamide 2 MG capsule Commonly known as:  IMODIUM Take 2 mg by mouth 4 (four) times daily as needed for diarrhea or loose stools.   LYRICA 100 MG capsule Generic drug:  pregabalin Take 100-200 mg by mouth 2 (two) times daily. 100 mg every morning and 200 mg at bedtime   meloxicam 15 MG tablet  Commonly known as:  MOBIC Take 15 mg by mouth daily.   metoprolol succinate 100 MG 24 hr tablet Commonly known as:  TOPROL-XL Take 100 mg by mouth daily.   ondansetron 8 MG tablet Commonly known as:  ZOFRAN Take 8 mg by mouth 2 (two) times daily.   promethazine 12.5 MG tablet Commonly known as:  PHENERGAN Take 12.5 mg by mouth every 6 (six) hours as needed for nausea or vomiting.   SAVELLA 50 MG Tabs tablet Generic drug:  Milnacipran Take 50 mg by mouth 2 (two) times daily.   SENNA LAXATIVE 8.6 MG tablet Generic  drug:  senna Take 8.6 mg by mouth at bedtime.   traMADol 50 MG tablet Commonly known as:  ULTRAM Take 50 mg by mouth every 8 (eight) hours as needed for moderate pain or severe pain.        DISCHARGE INSTRUCTIONS:  See AVS. If you experience worsening of your admission symptoms, develop shortness of breath, life threatening emergency, suicidal or homicidal thoughts you must seek medical attention immediately by calling 911 or calling your MD immediately  if symptoms less severe.  You Must read complete instructions/literature along with all the possible adverse reactions/side effects for all the Medicines you take and that have been prescribed to you. Take any new Medicines after you have completely understood and accpet all the possible adverse reactions/side effects.   Please note  You were cared for by a hospitalist during your hospital stay. If you have any questions about your discharge medications or the care you received while you were in the hospital after you are discharged, you can call the unit and asked to speak with the hospitalist on call if the hospitalist that took care of you is not available. Once you are discharged, your primary care physician will handle any further medical issues. Please note that NO REFILLS for any discharge medications will be authorized once you are discharged, as it is imperative that you return to your primary care physician (or establish a relationship with a primary care physician if you do not have one) for your aftercare needs so that they can reassess your need for medications and monitor your lab values.    On the day of Discharge:  VITAL SIGNS:  Blood pressure 135/72, pulse (!) 106, temperature 99.7 F (37.6 C), temperature source Oral, resp. rate 20, height 5' 4"  (1.626 m), weight 118 lb 6.4 oz (53.7 kg), SpO2 96 %. PHYSICAL EXAMINATION:  GENERAL:  62 y.o.-year-old patient lying in the bed with no acute distress.  EYES: Pupils equal,  round, reactive to light and accommodation. No scleral icterus. Extraocular muscles intact.  HEENT: Head atraumatic, normocephalic. Oropharynx and nasopharynx clear.  NECK:  Supple, no jugular venous distention. No thyroid enlargement, no tenderness.  LUNGS: Normal breath sounds bilaterally, no wheezing, rales,rhonchi or crepitation. No use of accessory muscles of respiration.  CARDIOVASCULAR: S1, S2 normal. No murmurs, rubs, or gallops.  ABDOMEN: Soft, non-tender, non-distended. Bowel sounds present. No organomegaly or mass.  EXTREMITIES: No pedal edema, cyanosis, or clubbing.  NEUROLOGIC: Cranial nerves II through XII are intact. Muscle strength 5/5 in all extremities. Sensation intact. Gait not checked.  PSYCHIATRIC: The patient is alert and oriented x 3.  SKIN: No obvious rash, lesion, or ulcer.  DATA REVIEW:   CBC Recent Labs  Lab 05/09/18 0741  WBC 3.7  HGB 7.3*  HCT 22.0*  PLT 203    Chemistries  Recent Labs  Lab 05/05/18 1727 05/06/18 0524  NA 134* 136  K 3.7 3.6  CL 101 107  CO2 22 20*  GLUCOSE 163* 121*  BUN 19 20  CREATININE 0.92 0.87  CALCIUM 9.5 8.4*  AST 25  --   ALT 22  --   ALKPHOS 205*  --   BILITOT 2.7*  --      Microbiology Results  Results for orders placed or performed during the hospital encounter of 05/05/18  Culture, blood (Routine x 2)     Status: None (Preliminary result)   Collection Time: 05/05/18  5:27 PM  Result Value Ref Range Status   Specimen Description BLOOD RIGHT CHEST PORT  Final   Special Requests   Final    BOTTLES DRAWN AEROBIC AND ANAEROBIC Blood Culture adequate volume   Culture   Final    NO GROWTH 4 DAYS Performed at [facility], [address] [place], [place] [address]    Report Status PENDING  Incomplete  Urine culture     Status: Abnormal   Collection Time: 05/05/18  5:27 PM  Result Value Ref Range Status   Specimen Description   Final    URINE, RANDOM Performed at [facility], [address] [place], [place] [address]    Special Requests   Final    NONE Performed at [facility], [address], [place], [place] [address]    Culture (A)  Final    <10,000 COLONIES/mL INSIGNIFICANT GROWTH Performed at [facility], [place] [address], [place], [place] [address]    Report Status 05/07/2018 FINAL  Final  Blood culture (single)     Status: Abnormal   Collection Time: 05/05/18  5:31 PM  Result Value Ref Range Status   Specimen Description   Final    BLOOD LEFT ANTECUBITAL Performed at [facility], [address] [place], [place] [address]    Special Requests   Final    BOTTLES DRAWN AEROBIC AND ANAEROBIC Blood Culture results may not be optimal due to an excessive volume of blood received in culture bottles Performed at [facility], [address], [place], [place] [address]    Culture  Setup Time   Final    GRAM NEGATIVE RODS GRAM POSITIVE COCCI IN BOTH AEROBIC AND ANAEROBIC BOTTLES CRITICAL RESULT CALLED TO, READ BACK BY AND VERIFIED WITH: DAVID BESANTI ON 05/06/18 AT 0435 JAG    Culture (A)  Final    KLEBSIELLA PNEUMONIAE VIRIDANS STREPTOCOCCUS THE SIGNIFICANCE OF ISOLATING THIS ORGANISM FROM A SINGLE SET OF BLOOD CULTURES WHEN MULTIPLE SETS ARE DRAWN IS UNCERTAIN. PLEASE NOTIFY THE MICROBIOLOGY DEPARTMENT WITHIN ONE WEEK IF SPECIATION AND SENSITIVITIES ARE REQUIRED. Performed at [facility], [place] [address], [place], [place] [address]    Report Status 05/09/2018 FINAL  Final   Organism ID, Bacteria KLEBSIELLA PNEUMONIAE  Final      Susceptibility   Klebsiella pneumoniae - MIC*    AMPICILLIN >=32 RESISTANT Resistant     CEFAZOLIN <=4 SENSITIVE Sensitive     CEFEPIME <=1 SENSITIVE Sensitive     CEFTAZIDIME <=1 SENSITIVE Sensitive     CEFTRIAXONE <=1 SENSITIVE Sensitive     CIPROFLOXACIN <=0.25 SENSITIVE Sensitive     GENTAMICIN <=1 SENSITIVE Sensitive     IMIPENEM <=0.25 SENSITIVE Sensitive     TRIMETH/SULFA  <=20 SENSITIVE Sensitive     AMPICILLIN/SULBACTAM 4 SENSITIVE Sensitive     PIP/TAZO <=4 SENSITIVE Sensitive     Extended ESBL NEGATIVE Sensitive     *  KLEBSIELLA PNEUMONIAE  Blood Culture ID Panel (Reflexed)     Status: Abnormal   Collection Time: 05/05/18  5:31 PM  Result Value Ref Range Status   Enterococcus species NOT DETECTED NOT DETECTED Final   Listeria monocytogenes NOT DETECTED NOT DETECTED Final   Staphylococcus species NOT DETECTED NOT DETECTED Final   Staphylococcus aureus NOT DETECTED NOT DETECTED Final   Streptococcus species DETECTED (A) NOT DETECTED Final    Comment: Not Enterococcus species, Streptococcus agalactiae, Streptococcus pyogenes, or Streptococcus pneumoniae. CRITICAL RESULT CALLED TO, READ BACK BY AND VERIFIED WITH: DAVID BESANTI ON 05/06/18  AT 0435 JAG    Streptococcus agalactiae NOT DETECTED NOT DETECTED Final   Streptococcus pneumoniae NOT DETECTED NOT DETECTED Final   Streptococcus pyogenes NOT DETECTED NOT DETECTED Final   Acinetobacter baumannii NOT DETECTED NOT DETECTED Final   Enterobacteriaceae species DETECTED (A) NOT DETECTED Final    Comment: Enterobacteriaceae represent a large family of gram-negative bacteria, not a single organism. CRITICAL RESULT CALLED TO, READ BACK BY AND VERIFIED WITH: DAVID BESANTI ON 05/06/18 AT 4709 JAG    Enterobacter cloacae complex NOT DETECTED NOT DETECTED Final   Escherichia coli NOT DETECTED NOT DETECTED Final   Klebsiella oxytoca NOT DETECTED NOT DETECTED Final   Klebsiella pneumoniae DETECTED (A) NOT DETECTED Final    Comment: CRITICAL RESULT CALLED TO, READ BACK BY AND VERIFIED WITH: DAVID BESANTI ON 05/06/18 AT 0435 JAG    Proteus species NOT DETECTED NOT DETECTED Final   Serratia marcescens NOT DETECTED NOT DETECTED Final   Carbapenem resistance NOT DETECTED NOT DETECTED Final   Haemophilus influenzae NOT DETECTED NOT DETECTED Final   Neisseria meningitidis NOT DETECTED NOT DETECTED Final    Pseudomonas aeruginosa NOT DETECTED NOT DETECTED Final   Candida albicans NOT DETECTED NOT DETECTED Final   Candida glabrata NOT DETECTED NOT DETECTED Final   Candida krusei NOT DETECTED NOT DETECTED Final   Candida parapsilosis NOT DETECTED NOT DETECTED Final   Candida tropicalis NOT DETECTED NOT DETECTED Final    Comment: Performed at [facility], [address] [place], [place] [address]  Culture, blood (Routine X 2) w Reflex to ID Panel     Status: None (Preliminary result)   Collection Time: 05/07/18  6:12 PM  Result Value Ref Range Status   Specimen Description BLOOD RIGHT ANTECUBITAL  Final   Special Requests   Final    BOTTLES DRAWN AEROBIC AND ANAEROBIC Blood Culture results may not be optimal due to an excessive volume of blood received in culture bottles   Culture   Final    NO GROWTH 2 DAYS Performed at [facility], [address] [place], [place] [address]    Report Status PENDING  Incomplete  Culture, blood (Routine X 2) w Reflex to ID Panel     Status: None (Preliminary result)   Collection Time: 05/07/18  6:21 PM  Result Value Ref Range Status   Specimen Description BLOOD BLOOD LEFT HAND  Final   Special Requests   Final    BOTTLES DRAWN AEROBIC AND ANAEROBIC Blood Culture adequate volume   Culture   Final    NO GROWTH 2 DAYS Performed at [facility], [address] [place], [place] [address]    Report Status PENDING  Incomplete    RADIOLOGY:  No results found.   Management plans discussed with the patient, her husband and they are in agreement.  CODE STATUS: DNR   TOTAL TIME TAKING CARE OF THIS PATIENT: 29  minutes.    Demetrios Loll M.D on 05/09/2018 at 11:47 AM  Between 7am to 6pm - Pager - [phone]  After 6pm go to [email or web address] - Proofreader  [facility]  Office  [phone]  CC: Primary care physician; System, Pcp Not In   Note: This dictation was prepared with Dragon  dictation along with smaller phrase technology. Any transcriptional errors that result from this process are unintentional.

## 2018-05-09 NOTE — Progress Notes (Signed)
Low grade fever x 1 this a.m with afebrile since. HGB 7.3 with pt receiving 1 unit of blood; reports she just feels weak today. Husband in and supportive. Desires to be discharged home with [facility] with IV antibiotic therapy with rx ready. Eating better. Slept at long interval this p.m. No diarrhea/loose stools today. WBC up to 3.7. Will plan discharge home after blood transfusion with post HBG check and IV antibiotic completion

## 2018-05-09 NOTE — Progress Notes (Signed)
Pt being discharged with IV antibiotic therapy with rx given. Josh, case Freight forwarder, notified and will make arrangements. Reports feeling better with generalized weakness. Improved appetite with pt eating outside food in improved amounts. Back pain controlled with tramadol with zofran. Low grade fever notified with VS;s stable.

## 2018-05-09 NOTE — Care Management Note (Signed)
Case Management Note  Patient Details  Name: Leslie Wallace MRN: [id number] Date of Birth: 06-Apr-1956  Subjective/Objective:     Patient to be discharged per MD order. RNCM consulted on patient as she has orders to be discharged on IV abx. Spoke with patient regarding scheduling of medication and assistance in home to help administer and patient fells comfortable proceeding with this plan. Referral placed with [name] from [place] who is in agreement to accept case. RNCM collaborated with pharmacy who plans of administering medication around 1500 and continuing this at an every 24hr scheduling time. [facility] home [facility] and primary RN notified of this plan. Family to provide transport home.  [name] RN BSN RNCM [phone]               Action/Plan:   Expected Discharge Date:  05/09/18               Expected Discharge Plan:  [place]  In-House Referral:     Discharge planning Services  CM Consult  Post Acute Care Choice:  Home Health Choice offered to:     DME Arranged:  IV pump/equipment DME Agency:  [facility] Arranged:  RN [facility] Agency:  [facility]  Status of Service:  Completed, signed off  If discussed at [place] of Stay Meetings, dates discussed:    Additional Comments:  [name] [name], RN 05/09/2018, 10:13 AM

## 2018-05-09 NOTE — Progress Notes (Signed)
Pt's hqb 8.9. [facility] doc notified, states it is okay to send patient home. Discharge instructions along with home medications and follow up gone over with patient and husband by Opal Sidles, RN. Port to remain accessed for antibiotics at home with [facility] follow up. Pt being discharged home on room air, no distress noted. Ammie Dalton, RN

## 2018-05-10 LAB — BPAM RBC
BLOOD PRODUCT EXPIRATION DATE: 201907242359
ISSUE DATE / TIME: 201907061249
UNIT TYPE AND RH: 6200

## 2018-05-10 LAB — TYPE AND SCREEN
ABO/RH(D): A POS
Antibody Screen: NEGATIVE
UNIT DIVISION: 0

## 2018-05-10 LAB — CULTURE, BLOOD (ROUTINE X 2)
CULTURE: NO GROWTH
SPECIAL REQUESTS: ADEQUATE

## 2018-05-12 LAB — CULTURE, BLOOD (ROUTINE X 2)
CULTURE: NO GROWTH
CULTURE: NO GROWTH
SPECIAL REQUESTS: ADEQUATE

## 2018-05-13 ENCOUNTER — Telehealth: Payer: Self-pay

## 2018-05-13 NOTE — Telephone Encounter (Signed)
EMMI Follow-up: Noted on the report the patient was sure about a follow-up appointment.  I talked with Leslie Wallace and asked if she had an opportunity to review her discharge paperwork yet and she responded no. I let her know it was recommended on her After Visit Summary to make a follow-up appointment with Dr. Rogue Bussing within 1 week and offered her the phone number but she said she would review the paperwork and call the number listed.  No needs noted for today.  I let her know there would be 2nd automated call with a different series of questions and to let us know if she had any needs at that time.

## 2018-06-26 ENCOUNTER — Emergency Department: Payer: Managed Care, Other (non HMO)

## 2018-06-26 ENCOUNTER — Observation Stay
Admission: EM | Admit: 2018-06-26 | Discharge: 2018-06-27 | Disposition: A | Discharge status: Home / Self Care | Payer: Managed Care, Other (non HMO) | Attending: Internal Medicine | Admitting: Internal Medicine

## 2018-06-26 ENCOUNTER — Other Ambulatory Visit: Payer: Self-pay

## 2018-06-26 ENCOUNTER — Encounter: Payer: Self-pay | Admitting: Emergency Medicine

## 2018-06-26 DIAGNOSIS — N179 Acute kidney failure, unspecified: Principal | ICD-10-CM | POA: Diagnosis present

## 2018-06-26 DIAGNOSIS — E860 Dehydration: Secondary | ICD-10-CM | POA: Diagnosis not present

## 2018-06-26 DIAGNOSIS — E878 Other disorders of electrolyte and fluid balance, not elsewhere classified: Secondary | ICD-10-CM | POA: Diagnosis not present

## 2018-06-26 DIAGNOSIS — Z66 Do not resuscitate: Secondary | ICD-10-CM | POA: Diagnosis not present

## 2018-06-26 DIAGNOSIS — R112 Nausea with vomiting, unspecified: Secondary | ICD-10-CM | POA: Insufficient documentation

## 2018-06-26 DIAGNOSIS — I10 Essential (primary) hypertension: Secondary | ICD-10-CM | POA: Insufficient documentation

## 2018-06-26 DIAGNOSIS — K589 Irritable bowel syndrome without diarrhea: Secondary | ICD-10-CM | POA: Diagnosis not present

## 2018-06-26 DIAGNOSIS — Z791 Long term (current) use of non-steroidal anti-inflammatories (NSAID): Secondary | ICD-10-CM | POA: Diagnosis not present

## 2018-06-26 DIAGNOSIS — C7951 Secondary malignant neoplasm of bone: Secondary | ICD-10-CM | POA: Insufficient documentation

## 2018-06-26 DIAGNOSIS — C189 Malignant neoplasm of colon, unspecified: Secondary | ICD-10-CM | POA: Diagnosis not present

## 2018-06-26 DIAGNOSIS — Z96659 Presence of unspecified artificial knee joint: Secondary | ICD-10-CM | POA: Insufficient documentation

## 2018-06-26 DIAGNOSIS — E46 Unspecified protein-calorie malnutrition: Secondary | ICD-10-CM | POA: Insufficient documentation

## 2018-06-26 DIAGNOSIS — C787 Secondary malignant neoplasm of liver and intrahepatic bile duct: Secondary | ICD-10-CM | POA: Diagnosis not present

## 2018-06-26 DIAGNOSIS — I959 Hypotension, unspecified: Secondary | ICD-10-CM | POA: Insufficient documentation

## 2018-06-26 DIAGNOSIS — E871 Hypo-osmolality and hyponatremia: Secondary | ICD-10-CM | POA: Diagnosis not present

## 2018-06-26 DIAGNOSIS — Z87891 Personal history of nicotine dependence: Secondary | ICD-10-CM | POA: Diagnosis not present

## 2018-06-26 DIAGNOSIS — T451X5A Adverse effect of antineoplastic and immunosuppressive drugs, initial encounter: Secondary | ICD-10-CM | POA: Insufficient documentation

## 2018-06-26 DIAGNOSIS — R531 Weakness: Secondary | ICD-10-CM | POA: Diagnosis present

## 2018-06-26 DIAGNOSIS — Z79899 Other long term (current) drug therapy: Secondary | ICD-10-CM | POA: Diagnosis not present

## 2018-06-26 LAB — GASTROINTESTINAL PANEL BY PCR, STOOL (REPLACES STOOL CULTURE)
ASTROVIRUS: NOT DETECTED
Adenovirus F40/41: NOT DETECTED
CYCLOSPORA CAYETANENSIS: NOT DETECTED
Campylobacter species: NOT DETECTED
Cryptosporidium: NOT DETECTED
ENTEROTOXIGENIC E COLI (ETEC): NOT DETECTED
Entamoeba histolytica: NOT DETECTED
Enteroaggregative E coli (EAEC): NOT DETECTED
Enteropathogenic E coli (EPEC): NOT DETECTED
Giardia lamblia: NOT DETECTED
NOROVIRUS GI/GII: NOT DETECTED
PLESIMONAS SHIGELLOIDES: NOT DETECTED
Rotavirus A: NOT DETECTED
SAPOVIRUS (I, II, IV, AND V): NOT DETECTED
SHIGA LIKE TOXIN PRODUCING E COLI (STEC): NOT DETECTED
Salmonella species: NOT DETECTED
Shigella/Enteroinvasive E coli (EIEC): NOT DETECTED
VIBRIO SPECIES: NOT DETECTED
Vibrio cholerae: NOT DETECTED
Yersinia enterocolitica: NOT DETECTED

## 2018-06-26 LAB — CBC WITH DIFFERENTIAL/PLATELET
Band Neutrophils: 4 %
Basophils Absolute: 0 10*3/uL (ref 0–0.1)
Basophils Relative: 0 %
Blasts: 0 %
EOS PCT: 6 %
Eosinophils Absolute: 0.1 10*3/uL (ref 0–0.7)
HCT: 27.6 % — ABNORMAL LOW (ref 35.0–47.0)
Hemoglobin: 9.3 g/dL — ABNORMAL LOW (ref 12.0–16.0)
LYMPHS ABS: 0.4 10*3/uL — AB (ref 1.0–3.6)
Lymphocytes Relative: 43 %
MCH: 29.8 pg (ref 26.0–34.0)
MCHC: 33.6 g/dL (ref 32.0–36.0)
MCV: 88.8 fL (ref 80.0–100.0)
MONOS PCT: 14 %
Metamyelocytes Relative: 4 %
Monocytes Absolute: 0.1 10*3/uL — ABNORMAL LOW (ref 0.2–0.9)
Myelocytes: 0 %
NEUTROS ABS: 0.4 10*3/uL — AB (ref 1.4–6.5)
NEUTROS PCT: 29 %
NRBC: 0 /100{WBCs}
Other: 0 %
PLATELETS: 75 10*3/uL — AB (ref 150–440)
Promyelocytes Relative: 0 %
RBC: 3.1 MIL/uL — AB (ref 3.80–5.20)
RDW: 20 % — AB (ref 11.5–14.5)
WBC: 1 10*3/uL — AB (ref 3.6–11.0)

## 2018-06-26 LAB — COMPREHENSIVE METABOLIC PANEL
ALT: 24 U/L (ref 0–44)
AST: 15 U/L (ref 15–41)
Albumin: 3.2 g/dL — ABNORMAL LOW (ref 3.5–5.0)
Alkaline Phosphatase: 197 U/L — ABNORMAL HIGH (ref 38–126)
Anion gap: 14 (ref 5–15)
BUN: 51 mg/dL — ABNORMAL HIGH (ref 8–23)
CHLORIDE: 95 mmol/L — AB (ref 98–111)
CO2: 18 mmol/L — ABNORMAL LOW (ref 22–32)
CREATININE: 3.35 mg/dL — AB (ref 0.44–1.00)
Calcium: 8.4 mg/dL — ABNORMAL LOW (ref 8.9–10.3)
GFR calc Af Amer: 16 mL/min — ABNORMAL LOW (ref >60)
GFR, EST NON AFRICAN AMERICAN: 14 mL/min — AB (ref >60)
Glucose, Bld: 111 mg/dL — ABNORMAL HIGH (ref 70–99)
POTASSIUM: 4.2 mmol/L (ref 3.5–5.1)
Sodium: 127 mmol/L — ABNORMAL LOW (ref 135–145)
Total Bilirubin: 1.9 mg/dL — ABNORMAL HIGH (ref 0.3–1.2)
Total Protein: 7.3 g/dL (ref 6.5–8.1)

## 2018-06-26 LAB — LACTIC ACID, PLASMA: LACTIC ACID, VENOUS: 1.6 mmol/L (ref 0.5–1.9)

## 2018-06-26 LAB — TROPONIN I

## 2018-06-26 LAB — TSH: TSH: 1.677 u[IU]/mL (ref 0.350–4.500)

## 2018-06-26 MED ORDER — MILNACIPRAN HCL 50 MG PO TABS
50.0000 mg | ORAL_TABLET | Freq: Two times a day (BID) | ORAL | Status: DC
  Administered 2018-06-26 – 2018-06-27 (×2): 50 mg via ORAL
  Filled 2018-06-26 (×3): qty 1

## 2018-06-26 MED ORDER — LINACLOTIDE 145 MCG PO CAPS
145.0000 ug | ORAL_CAPSULE | Freq: Every day | ORAL | Status: DC
  Administered 2018-06-27: 145 ug via ORAL
  Filled 2018-06-26: qty 1

## 2018-06-26 MED ORDER — ONDANSETRON HCL 4 MG/2ML IJ SOLN
4.0000 mg | Freq: Once | INTRAMUSCULAR | Status: AC
  Administered 2018-06-26: 4 mg via INTRAVENOUS
  Filled 2018-06-26: qty 2

## 2018-06-26 MED ORDER — ONDANSETRON HCL 4 MG/2ML IJ SOLN: 4.0000 mg | Freq: Four times a day (QID) | INTRAMUSCULAR | Status: DC | PRN

## 2018-06-26 MED ORDER — SODIUM CHLORIDE 0.9 % IV SOLN
1000.0000 mL | Freq: Once | INTRAVENOUS | Status: AC
  Administered 2018-06-26: 1000 mL via INTRAVENOUS

## 2018-06-26 MED ORDER — HYDROCODONE-ACETAMINOPHEN 5-325 MG PO TABS: 1.0000 | ORAL_TABLET | ORAL | Status: DC | PRN

## 2018-06-26 MED ORDER — TRAMADOL HCL 50 MG PO TABS: 50.0000 mg | ORAL_TABLET | Freq: Three times a day (TID) | ORAL | Status: DC | PRN

## 2018-06-26 MED ORDER — DEXTROSE-NACL 5-0.9 % IV SOLN
INTRAVENOUS | Status: DC
  Administered 2018-06-26: 18:00:00 via INTRAVENOUS

## 2018-06-26 MED ORDER — PROMETHAZINE HCL 25 MG/ML IJ SOLN: 12.5000 mg | Freq: Four times a day (QID) | INTRAMUSCULAR | Status: DC | PRN

## 2018-06-26 MED ORDER — ADULT MULTIVITAMIN W/MINERALS CH
1.0000 | ORAL_TABLET | Freq: Every day | ORAL | Status: DC
  Administered 2018-06-26 – 2018-06-27 (×2): 1 via ORAL
  Filled 2018-06-26 (×2): qty 1

## 2018-06-26 MED ORDER — ACETAMINOPHEN 325 MG PO TABS: 650.0000 mg | ORAL_TABLET | Freq: Four times a day (QID) | ORAL | Status: DC | PRN

## 2018-06-26 MED ORDER — LOPERAMIDE HCL 2 MG PO CAPS: 2.0000 mg | ORAL_CAPSULE | Freq: Four times a day (QID) | ORAL | Status: DC | PRN

## 2018-06-26 MED ORDER — ACETAMINOPHEN 650 MG RE SUPP: 650.0000 mg | Freq: Four times a day (QID) | RECTAL | Status: DC | PRN

## 2018-06-26 MED ORDER — PREGABALIN 50 MG PO CAPS
100.0000 mg | ORAL_CAPSULE | Freq: Every morning | ORAL | Status: DC
  Administered 2018-06-27: 100 mg via ORAL
  Filled 2018-06-26: qty 2

## 2018-06-26 MED ORDER — SODIUM BICARBONATE 650 MG PO TABS
650.0000 mg | ORAL_TABLET | Freq: Three times a day (TID) | ORAL | Status: DC
  Administered 2018-06-26 – 2018-06-27 (×3): 650 mg via ORAL
  Filled 2018-06-26 (×3): qty 1

## 2018-06-26 MED ORDER — DICYCLOMINE HCL 10 MG PO CAPS
10.0000 mg | ORAL_CAPSULE | Freq: Three times a day (TID) | ORAL | Status: DC
  Administered 2018-06-26 – 2018-06-27 (×4): 10 mg via ORAL
  Filled 2018-06-26 (×4): qty 1

## 2018-06-26 MED ORDER — ONDANSETRON HCL 4 MG PO TABS: 4.0000 mg | ORAL_TABLET | Freq: Four times a day (QID) | ORAL | Status: DC | PRN

## 2018-06-26 MED ORDER — PREGABALIN 75 MG PO CAPS
200.0000 mg | ORAL_CAPSULE | Freq: Every day | ORAL | Status: DC
  Administered 2018-06-26: 200 mg via ORAL
  Filled 2018-06-26: qty 1

## 2018-06-26 NOTE — ED Provider Notes (Signed)
[facility] Emergency Department Provider Note   ____________________________________________    I have reviewed the triage vital signs and the nursing notes.   HISTORY  Chief Complaint Weakness; Fatigue; and Shortness of Breath     HPI Leslie Wallace is a 62 y.o. female who presents with complaints of nausea vomiting diarrhea and weakness.  Patient states "I feel like I got hit by a truck".  She denies fevers.  Patient reports she has stage IV colon cancer, she states almost 2 weeks ago she had "double dose "of chemotherapy.  Over the last several days she has had intensive nausea vomiting and diarrhea, unable to tolerate p.o.'s.  Denies abdominal pain.  Patient is from [place] and her oncologist is there, they are in town to move mother and to nursing home   Past Medical History:  Diagnosis Date  . Colon cancer ([place])    stage 4- mets to liver adn bone  . Fibromyalgia   . History of IBS   . Hypertension     Patient Active Problem List   Diagnosis Date Noted  . Neutropenic fever ([place]) 05/05/2018    Past Surgical History:  Procedure Laterality Date  . PORTACATH PLACEMENT    . REPLACEMENT TOTAL KNEE      Prior to Admission medications   Medication Sig Start Date End Date Taking? Authorizing Provider  amLODipine (NORVASC) 10 MG tablet Take 10 mg by mouth daily.   Yes [provider]  cyanocobalamin (,VITAMIN B-12,) 1000 MCG/ML injection Inject 1,000 mcg into the muscle every 28 (twenty-eight) days.   Yes [provider]  denosumab (XGEVA) 120 MG/1.7ML SOLN injection Inject 120 mg into the skin every 28 (twenty-eight) days.   Yes [provider]  dicyclomine (BENTYL) 10 MG capsule Take 10 mg by mouth 4 (four) times daily -  before meals and at bedtime.   Yes [provider]  famotidine (PEPCID) 20 MG tablet Take 20 mg by mouth 2 (two) times daily.   Yes [provider]  linaclotide (LINZESS) 145  MCG CAPS capsule Take 145 mg by mouth daily before breakfast.    Yes [provider]  lisinopril (PRINIVIL,ZESTRIL) 20 MG tablet Take 20 mg by mouth at bedtime.    Yes [provider]  loperamide (IMODIUM) 2 MG capsule Take 2 mg by mouth 4 (four) times daily as needed for diarrhea or loose stools.    Yes [provider]  meloxicam (MOBIC) 15 MG tablet Take 15 mg by mouth daily.   Yes [provider]  metoprolol succinate (TOPROL-XL) 100 MG 24 hr tablet Take 100 mg by mouth daily.   Yes [provider]  Milnacipran (SAVELLA) 50 MG TABS tablet Take 50 mg by mouth 2 (two) times daily.   Yes [provider]  ondansetron (ZOFRAN) 8 MG tablet Take 8 mg by mouth 2 (two) times daily.    Yes [provider]  pregabalin (LYRICA) 100 MG capsule Take 100-200 mg by mouth See admin instructions. 100 mg every morning and 200 mg at bedtime   Yes [provider]  promethazine (PHENERGAN) 12.5 MG tablet Take 12.5 mg by mouth every 6 (six) hours as needed for nausea or vomiting.   Yes [provider]  senna (SENNA LAXATIVE) 8.6 MG tablet Take 8.6 mg by mouth at bedtime.   Yes [provider]  traMADol (ULTRAM) 50 MG tablet Take 50 mg by mouth every 8 (eight) hours as needed for moderate  pain or severe pain.    Yes [provider]  cefTRIAXone 2 g in sodium chloride 0.9 % 100 mL Inject 2 g into the vein daily at 6 PM. Patient not taking: Reported on 06/26/2018 05/09/18   Demetrios Loll, MD  dexamethasone (DECADRON) 4 MG tablet Take 8 mg by mouth every evening. On days 2 and 3 of each cycle    [provider]     Allergies Patient has no known allergies.  Family History  Problem Relation Age of Onset  . Colon cancer Father     Social History Social History   Tobacco Use  . Smoking status: Former Research scientist (life sciences)  . Smokeless tobacco: Never Used  . Tobacco comment: quit about 12 years ago  Substance Use Topics  .  Alcohol use: Not Currently    Comment: Rare  . Drug use: Never    Review of Systems  Constitutional: No fevers Eyes: No visual changes.  ENT: No sore throat. Cardiovascular: Denies chest pain. Respiratory: Denies shortness of breath. Gastrointestinal: As above Genitourinary: Negative for dysuria. Musculoskeletal: Negative for back pain. Skin: Negative for rash. Neurological: Negative for headaches   ____________________________________________   PHYSICAL EXAM:  VITAL SIGNS: ED Triage Vitals  Enc Vitals Group     BP 06/26/18 1203 (!) 76/45     Pulse Rate 06/26/18 1203 (!) 111     Resp 06/26/18 1203 16     Temp 06/26/18 1359 (!) 97.5 F (36.4 C)     Temp Source 06/26/18 1359 Axillary     SpO2 06/26/18 1203 99 %     Weight 06/26/18 1206 54.9 kg (121 lb)     Height 06/26/18 1206 1.626 m (5\' 4" )     Head Circumference --      Peak Flow --      Pain Score 06/26/18 1206 0     Pain Loc --      Pain Edu? --      Excl. in [place]? --     Constitutional: Alert and oriented, ill-appearing, cachectic Eyes: Conjunctivae are normal.  Head: Atraumatic.  Mouth/Throat: Mucous membranes are dry  Cardiovascular: Normal rate, regular rhythm. Grossly normal heart sounds.  Good peripheral circulation. Respiratory: Normal respiratory effort.  No retractions.  Gastrointestinal: Soft and nontender. No distention.    Musculoskeletal: No lower extremity tenderness nor edema.  Warm and well perfused Neurologic:  Normal speech and language. No gross focal neurologic deficits are appreciated.  Skin:  Skin is warm, dry and intact Psychiatric: Mood and affect are normal. Speech and behavior are normal.  ____________________________________________   LABS (all labs ordered are listed, but only abnormal results are displayed)  Labs Reviewed  CBC WITH DIFFERENTIAL/PLATELET - Abnormal; Notable for the following components:      Result Value   WBC 1.0 (*)    RBC 3.10 (*)    Hemoglobin 9.3  (*)    HCT 27.6 (*)    RDW 20.0 (*)    Platelets 75 (*)    Neutro Abs 0.4 (*)    Lymphs Abs 0.4 (*)    Monocytes Absolute 0.1 (*)    All other components within normal limits  COMPREHENSIVE METABOLIC PANEL - Abnormal; Notable for the following components:   Sodium 127 (*)    Chloride 95 (*)    CO2 18 (*)    Glucose, Bld 111 (*)    BUN 51 (*)    Creatinine, Ser 3.35 (*)    Calcium 8.4 (*)  Albumin 3.2 (*)    Alkaline Phosphatase 197 (*)    Total Bilirubin 1.9 (*)    GFR calc non Af Amer 14 (*)    GFR calc Af Amer 16 (*)    All other components within normal limits  TROPONIN I  LACTIC ACID, PLASMA   ____________________________________________  EKG  None ____________________________________________  RADIOLOGY  Chest x-ray negative for pneumonia ____________________________________________   PROCEDURES  Procedure(s) performed: No  Procedures   Critical Care performed:yes  CRITICAL CARE Performed by: Lavonia Drafts   Total critical care time: 30 minutes  Critical care time was exclusive of separately billable procedures and treating other patients.  Critical care was necessary to treat or prevent imminent or life-threatening deterioration.  Critical care was time spent personally by me on the following activities: development of treatment plan with patient and/or surrogate as well as nursing, discussions with consultants, evaluation of patient's response to treatment, examination of patient, obtaining history from patient or surrogate, ordering and performing treatments and interventions, ordering and review of laboratory studies, ordering and review of radiographic studies, pulse oximetry and re-evaluation of patient's condition.  ____________________________________________   INITIAL IMPRESSION / ASSESSMENT AND PLAN / ED COURSE  Pertinent labs & imaging results that were available during my care of the patient were reviewed by me and considered in my  medical decision making (see chart for details).  Patient presents with severe weakness, hypotension upon arrival.  Exam is most concerning for dehydration given recent chemotherapy.  IV fluids, IV Zofran infused.  Pending labs  Patient has [place] of 290, she is neutropenic.  She is afebrile.  Creatinine is 3.35 with a BUN of 51, this is markedly increased from prior likely related to dehydration.  She requires hospitalization for acute renal failure secondary to  dehydration   ____________________________________________   FINAL CLINICAL IMPRESSION(S) / ED DIAGNOSES  Final diagnoses:  Acute renal failure, unspecified acute renal failure type ([place])  Dehydration  Weakness        Note:  This document was prepared using Dragon voice recognition software and may include unintentional dictation errors.    Lavonia Drafts, MD 06/26/18 1432

## 2018-06-26 NOTE — ED Notes (Signed)
Pt in xray

## 2018-06-26 NOTE — H&P (Signed)
[facility] NAME: Leslie Wallace    MR#:  [id number]  DATE OF BIRTH:  1956-10-08  DATE OF ADMISSION:  06/26/2018  PRIMARY CARE PHYSICIAN: System, Pcp Not In   REQUESTING/REFERRING PHYSICIAN:   CHIEF COMPLAINT:   Chief Complaint  Patient presents with  . Weakness  . Fatigue  . Shortness of Breath    HISTORY OF PRESENT ILLNESS: Leslie Wallace  is a 62 y.o. female with a known history of stage IV colon cancer on chemotherapy-received double dose chemotherapy on June 16, 2018, presents with acute nausea/vomiting/diarrhea, generalized weakness, fatigue, emergency room patient was found to be tachycardic, hypotensive, white count 1, hemoglobin 9.3-up from 8.9 previously, sodium 129, chloride 95, bicarb 18, creatinine 3.3 with baseline normal, lactic acid was normal, chest x-ray was negative, patient evaluated emergency room, no apparent distress, resting company in bed, patient denies possible food poisoning, has not eaten anything in 2 days, patient denies any sick contacts, patient is now being admitted for acute renal failure with nausea, vomiting, diarrhea most likely secondary to chemotherapy.  PAST MEDICAL HISTORY:   Past Medical History:  Diagnosis Date  . Colon cancer ([place])    stage 4- mets to liver adn bone  . Fibromyalgia   . History of IBS   . Hypertension     PAST SURGICAL HISTORY:  Past Surgical History:  Procedure Laterality Date  . PORTACATH PLACEMENT    . REPLACEMENT TOTAL KNEE      SOCIAL HISTORY:  Social History   Tobacco Use  . Smoking status: Former Research scientist (life sciences)  . Smokeless tobacco: Never Used  . Tobacco comment: quit about 12 years ago  Substance Use Topics  . Alcohol use: Not Currently    Comment: Rare    FAMILY HISTORY:  Family History  Problem Relation Age of Onset  . Colon cancer Father     DRUG ALLERGIES: No Known Allergies  REVIEW OF SYSTEMS:   CONSTITUTIONAL: No fever,+ fatigue, weakness.   EYES: No blurred or double vision.  EARS, NOSE, AND THROAT: No tinnitus or ear pain.  RESPIRATORY: No cough, shortness of breath, wheezing or hemoptysis.  CARDIOVASCULAR: No chest pain, orthopnea, edema.  GASTROINTESTINAL: + nausea, vomiting, diarrhea   GENITOURINARY: No dysuria, hematuria.  ENDOCRINE: No polyuria, nocturia,  HEMATOLOGY: No anemia, easy bruising or bleeding SKIN: No rash or lesion. MUSCULOSKELETAL: No joint pain or arthritis.   NEUROLOGIC: No tingling, numbness, weakness.  PSYCHIATRY: No anxiety or depression.   MEDICATIONS AT HOME:  Prior to Admission medications   Medication Sig Start Date End Date Taking? Authorizing Provider  amLODipine (NORVASC) 10 MG tablet Take 10 mg by mouth daily.   Yes [provider]  cyanocobalamin (,VITAMIN B-12,) 1000 MCG/ML injection Inject 1,000 mcg into the muscle every 28 (twenty-eight) days.   Yes [provider]  denosumab (XGEVA) 120 MG/1.7ML SOLN injection Inject 120 mg into the skin every 28 (twenty-eight) days.   Yes [provider]  dicyclomine (BENTYL) 10 MG capsule Take 10 mg by mouth 4 (four) times daily -  before meals and at bedtime.   Yes [provider]  famotidine (PEPCID) 20 MG tablet Take 20 mg by mouth 2 (two) times daily.   Yes [provider]  linaclotide (LINZESS) 145 MCG CAPS capsule Take 145 mg by mouth daily before breakfast.    Yes [provider]  lisinopril (PRINIVIL,ZESTRIL) 20 MG tablet Take 20 mg by mouth at bedtime.  Yes [provider]  loperamide (IMODIUM) 2 MG capsule Take 2 mg by mouth 4 (four) times daily as needed for diarrhea or loose stools.    Yes [provider]  meloxicam (MOBIC) 15 MG tablet Take 15 mg by mouth daily.   Yes [provider]  metoprolol succinate (TOPROL-XL) 100 MG 24 hr tablet Take 100 mg by mouth daily.   Yes [provider]  Milnacipran (SAVELLA) 50 MG TABS tablet Take 50 mg by mouth 2  (two) times daily.   Yes [provider]  ondansetron (ZOFRAN) 8 MG tablet Take 8 mg by mouth 2 (two) times daily.    Yes [provider]  pregabalin (LYRICA) 100 MG capsule Take 100-200 mg by mouth See admin instructions. 100 mg every morning and 200 mg at bedtime   Yes [provider]  promethazine (PHENERGAN) 12.5 MG tablet Take 12.5 mg by mouth every 6 (six) hours as needed for nausea or vomiting.   Yes [provider]  senna (SENNA LAXATIVE) 8.6 MG tablet Take 8.6 mg by mouth at bedtime.   Yes [provider]  traMADol (ULTRAM) 50 MG tablet Take 50 mg by mouth every 8 (eight) hours as needed for moderate pain or severe pain.    Yes [provider]  dexamethasone (DECADRON) 4 MG tablet Take 8 mg by mouth every evening. On days 2 and 3 of each cycle    [provider]      PHYSICAL EXAMINATION:   VITAL SIGNS: Blood pressure (!) 90/57, pulse 87, temperature (!) 97.5 F (36.4 C), temperature source Axillary, resp. rate 16, height 5\' 4"  (1.626 m), weight 54.9 kg, SpO2 100 %.  GENERAL:  62 y.o.-year-old patient lying in the bed with no acute distress.  Cachectic EYES: Pupils equal, round, reactive to light and accommodation. No scleral icterus. Extraocular muscles intact.  HEENT: Head atraumatic, normocephalic. Oropharynx and nasopharynx clear.  NECK:  Supple, no jugular venous distention. No thyroid enlargement, no tenderness.  LUNGS: Normal breath sounds bilaterally, no wheezing, rales,rhonchi or crepitation. No use of accessory muscles of respiration.  CARDIOVASCULAR: S1, S2 normal. No murmurs, rubs, or gallops.  ABDOMEN: Soft, nontender, nondistended. Bowel sounds present. No organomegaly or mass.  EXTREMITIES: No pedal edema, cyanosis, or clubbing.  Diffuse muscular wasting NEUROLOGIC: Cranial nerves II through XII are intact. MAES. Gait not checked.  PSYCHIATRIC: The patient is alert and oriented x 3.  SKIN: No obvious rash,  lesion, or ulcer.   LABORATORY PANEL:   CBC Recent Labs  Lab 06/26/18 1220  WBC 1.0*  HGB 9.3*  HCT 27.6*  PLT 75*  MCV 88.8  MCH 29.8  MCHC 33.6  RDW 20.0*  LYMPHSABS 0.4*  MONOABS 0.1*  EOSABS 0.1  BASOSABS 0.0   ------------------------------------------------------------------------------------------------------------------  Chemistries  Recent Labs  Lab 06/26/18 1220  NA 127*  K 4.2  CL 95*  CO2 18*  GLUCOSE 111*  BUN 51*  CREATININE 3.35*  CALCIUM 8.4*  AST 15  ALT 24  ALKPHOS 197*  BILITOT 1.9*   ------------------------------------------------------------------------------------------------------------------ estimated creatinine clearance is 15 mL/min (A) (by C-G formula based on SCr of 3.35 mg/dL (H)). ------------------------------------------------------------------------------------------------------------------ No results for input(s): TSH, T4TOTAL, T3FREE, THYROIDAB in the last 72 hours.  Invalid input(s): FREET3   Coagulation profile No results for input(s): INR, PROTIME in the last 168 hours. ------------------------------------------------------------------------------------------------------------------- No results for input(s): DDIMER in the last 72 hours. -------------------------------------------------------------------------------------------------------------------  Cardiac Enzymes Recent Labs  Lab 06/26/18 1220  TROPONINI <0.03   ------------------------------------------------------------------------------------------------------------------  Invalid input(s): POCBNP  ---------------------------------------------------------------------------------------------------------------  Urinalysis    Component Value Date/Time   COLORURINE YELLOW (A) 05/05/2018 1727   COLORURINE YELLOW (A) 05/05/2018 1727   APPEARANCEUR CLEAR (A) 05/05/2018 1727   APPEARANCEUR CLEAR (A) 05/05/2018 1727   LABSPEC 1.042 (H) 05/05/2018 1727    LABSPEC 1.042 (H) 05/05/2018 1727   PHURINE 6.0 05/05/2018 1727   PHURINE 6.0 05/05/2018 1727   GLUCOSEU NEGATIVE 05/05/2018 1727   GLUCOSEU NEGATIVE 05/05/2018 1727   HGBUR NEGATIVE 05/05/2018 1727   HGBUR NEGATIVE 05/05/2018 1727   BILIRUBINUR NEGATIVE 05/05/2018 1727   BILIRUBINUR NEGATIVE 05/05/2018 1727   KETONESUR NEGATIVE 05/05/2018 1727   KETONESUR NEGATIVE 05/05/2018 1727   PROTEINUR 30 (A) 05/05/2018 1727   PROTEINUR 30 (A) 05/05/2018 1727   NITRITE NEGATIVE 05/05/2018 1727   NITRITE NEGATIVE 05/05/2018 1727   LEUKOCYTESUR NEGATIVE 05/05/2018 1727   LEUKOCYTESUR NEGATIVE 05/05/2018 1727     RADIOLOGY: Dg Chest 2 View  Result Date: 06/26/2018 CLINICAL DATA:  Shortness of breath over the past week. History of colon cancer. EXAM: CHEST - 2 VIEW COMPARISON:  PA and lateral chest 05/05/2018. FINDINGS: Port-A-Cath is unchanged. Lungs are clear. No pneumothorax or pleural effusion. No acute or focal bony abnormality. IMPRESSION: No acute disease. Electronically Signed   By: Inge Rise M.D.   On: 06/26/2018 13:48    EKG: Orders placed or performed during the hospital encounter of 05/05/18  . EKG 12-Lead  . EKG 12-Lead  . 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 Management plans discussed with  the patient, family and they are in agreement.  CODE STATUS:DNR Code Status History    Date Active Date Inactive Code Status Order ID Comments User Context   05/05/2018 2039 05/10/2018 0052 DNR [id number]  Gladstone Lighter, MD Inpatient    Questions for Most Recent Historical Code Status (Order [id number])    Question Answer Comment   In the event of cardiac or respiratory ARREST Do not call a "code blue"    In the event of cardiac or respiratory ARREST Do not perform Intubation, CPR, defibrillation or ACLS    In the event of cardiac or respiratory ARREST Use medication by any route, position, wound care, and other measures to relive pain and suffering. May use oxygen, suction and manual treatment of airway obstruction as needed for comfort.         Advance Directive Documentation     Most Recent Value  Type of Advance Directive  Healthcare Power of Attorney, Living will  Pre-existing out of facility DNR order (yellow form or pink MOST form)  -  "MOST" Form in Place?  -       TOTAL TIME TAKING CARE OF THIS PATIENT: 45 minutes.    Avel Peace Jacara Benito M.D on 06/26/2018   Between 7am to 6pm - Pager - [phone]  After 6pm go to [email or web address] - password [email or web address]  [employer]  [phone]  CC: Primary care physician; System, Pcp Not In   Note: This dictation was prepared with Dragon dictation along with smaller phrase technology. Any transcriptional errors that result from this process are unintentional.

## 2018-06-26 NOTE — Progress Notes (Signed)
Family Meeting Note  Advance Directive:yes  Today a meeting took place with the Patient.  Patient is able to participate    The following clinical team members were present during this meeting:MD  The following were discussed:Patient's diagnosis: Colon cancer, Patient's progosis: Unable to determine and Goals for treatment: DNR  Additional follow-up to be provided: prn  Time spent during discussion:20 minutes  Gorden Harms, MD

## 2018-06-26 NOTE — ED Triage Notes (Signed)
SOB worsening over past week.  Last chemo was 8/13.  Says has been feeling weak and fatigued since chemo.  Chemo was increased in June and patient states she has felt poorly since.

## 2018-06-26 NOTE — ED Notes (Signed)
Patient c/o weakness all over with worsening SOB. Last chemo treatment was last Tuesday

## 2018-06-27 LAB — BASIC METABOLIC PANEL
ANION GAP: 6 (ref 5–15)
BUN: 44 mg/dL — ABNORMAL HIGH (ref 8–23)
CALCIUM: 7.7 mg/dL — AB (ref 8.9–10.3)
CO2: 21 mmol/L — ABNORMAL LOW (ref 22–32)
Chloride: 106 mmol/L (ref 98–111)
Creatinine, Ser: 1.46 mg/dL — ABNORMAL HIGH (ref 0.44–1.00)
GFR, EST AFRICAN AMERICAN: 43 mL/min — AB (ref >60)
GFR, EST NON AFRICAN AMERICAN: 37 mL/min — AB (ref >60)
Glucose, Bld: 130 mg/dL — ABNORMAL HIGH (ref 70–99)
Potassium: 3.1 mmol/L — ABNORMAL LOW (ref 3.5–5.1)
Sodium: 133 mmol/L — ABNORMAL LOW (ref 135–145)

## 2018-06-27 MED ORDER — POTASSIUM CHLORIDE CRYS ER 20 MEQ PO TBCR
40.0000 meq | EXTENDED_RELEASE_TABLET | Freq: Once | ORAL | Status: AC
  Administered 2018-06-27: 40 meq via ORAL
  Filled 2018-06-27: qty 2

## 2018-06-27 MED ORDER — SODIUM CHLORIDE 0.9 % IV SOLN: Freq: Once | INTRAVENOUS | Status: DC

## 2018-06-27 MED ORDER — HEPARIN SOD (PORK) LOCK FLUSH 100 UNIT/ML IV SOLN
500.0000 [IU] | Freq: Once | INTRAVENOUS | Status: AC
  Administered 2018-06-27: 500 [IU] via INTRAVENOUS
  Filled 2018-06-27: qty 5

## 2018-06-27 MED ORDER — SODIUM CHLORIDE 0.9 % IV BOLUS
500.0000 mL | Freq: Once | INTRAVENOUS | Status: AC
  Administered 2018-06-27: 500 mL via INTRAVENOUS

## 2018-06-27 NOTE — Discharge Summary (Signed)
[facility] NAME: Leslie Wallace    MR#:  [id number]  DATE OF BIRTH:  [birth year]  DATE OF ADMISSION:  06/26/2018 ADMITTING PHYSICIAN: Avel Peace Salary, MD  DATE OF DISCHARGE: 06/27/2018  PRIMARY CARE PHYSICIAN: System, Pcp Not In    ADMISSION DIAGNOSIS:  Dehydration [E86.0] Weakness [R53.1] Acute renal failure, unspecified acute renal failure type ([place]) [N17.9]  DISCHARGE DIAGNOSIS:  Active Problems:   ARF (acute renal failure) ([place])   SECONDARY DIAGNOSIS:   Past Medical History:  Diagnosis Date  . Colon cancer ([place])    stage 4- mets to liver adn bone  . Fibromyalgia   . History of IBS   . Hypertension     HOSPITAL COURSE:   62 year old female with history of metastatic stage IV colon cancer on chemotherapy who presented with generalized weakness.  1.  Acute kidney injury in the setting of nausea, vomiting and chemotherapy. Creatinine is improved with IV fluids.  2.  Nausea and vomiting after chemotherapy: She is now tolerating her diet  3.  Hypotension history of hypertension: Due to weight loss with chemo and poor p.o. intake she does not require blood pressure medications at this time.  Her outpatient  blood pressure medications have been discontinued.    4.  Stage IV colon cancer with metastatic disease: Patient will follow-up with her oncologist in [place]   5.  Protein calorie malnutrition due to colon cancer  6.  Hyponatremia from dehydration which is improved with IV fluids DISCHARGE CONDITIONS AND DIET:   Stable for discharge on regular diet  CONSULTS OBTAINED:    DRUG ALLERGIES:  No Known Allergies  DISCHARGE MEDICATIONS:   Allergies as of 06/27/2018   No Known Allergies     Medication List    STOP taking these medications   amLODipine 10 MG tablet Commonly known as:  NORVASC   lisinopril 20 MG tablet Commonly known as:  PRINIVIL,ZESTRIL   metoprolol succinate 100 MG 24 hr  tablet Commonly known as:  TOPROL-XL     TAKE these medications   cyanocobalamin 1000 MCG/ML injection Commonly known as:  (VITAMIN B-12) Inject 1,000 mcg into the muscle every 28 (twenty-eight) days.   denosumab 120 MG/1.7ML Soln injection Commonly known as:  XGEVA Inject 120 mg into the skin every 28 (twenty-eight) days.   dexamethasone 4 MG tablet Commonly known as:  DECADRON Take 8 mg by mouth every evening. On days 2 and 3 of each cycle   dicyclomine 10 MG capsule Commonly known as:  BENTYL Take 10 mg by mouth 4 (four) times daily -  before meals and at bedtime.   famotidine 20 MG tablet Commonly known as:  PEPCID Take 20 mg by mouth 2 (two) times daily.   LINZESS 145 MCG Caps capsule Generic drug:  linaclotide Take 145 mg by mouth daily before breakfast.   loperamide 2 MG capsule Commonly known as:  IMODIUM Take 2 mg by mouth 4 (four) times daily as needed for diarrhea or loose stools.   LYRICA 100 MG capsule Generic drug:  pregabalin Take 100-200 mg by mouth See admin instructions. 100 mg every morning and 200 mg at bedtime   meloxicam 15 MG tablet Commonly known as:  MOBIC Take 15 mg by mouth daily.   ondansetron 8 MG tablet Commonly known as:  ZOFRAN Take 8 mg by mouth 2 (two) times daily.   promethazine 12.5 MG tablet Commonly known as:  PHENERGAN Take 12.5 mg by  mouth every 6 (six) hours as needed for nausea or vomiting.   SAVELLA 50 MG Tabs tablet Generic drug:  Milnacipran Take 50 mg by mouth 2 (two) times daily.   SENNA LAXATIVE 8.6 MG tablet Generic drug:  senna Take 8.6 mg by mouth at bedtime.   traMADol 50 MG tablet Commonly known as:  ULTRAM Take 50 mg by mouth every 8 (eight) hours as needed for moderate pain or severe pain.         Today   CHIEF COMPLAINT:   No acute issues patient is tolerating her diet nausea and vomiting improved   VITAL SIGNS:  Blood pressure (!) 81/50, pulse 93, temperature 98.7 F (37.1 C),  temperature source Oral, resp. rate 18, height 5\' 4"  (1.626 m), weight 55.2 kg, SpO2 100 %.   REVIEW OF SYSTEMS:  Review of Systems  Constitutional: Negative.  Negative for chills, fever and malaise/fatigue.  HENT: Negative.  Negative for ear discharge, ear pain, hearing loss, nosebleeds and sore throat.   Eyes: Negative.  Negative for blurred vision and pain.  Respiratory: Negative.  Negative for cough, hemoptysis, shortness of breath and wheezing.   Cardiovascular: Negative.  Negative for chest pain, palpitations and leg swelling.  Gastrointestinal: Negative.  Negative for abdominal pain, blood in stool, diarrhea, nausea and vomiting.  Genitourinary: Negative.  Negative for dysuria.  Musculoskeletal: Negative.  Negative for back pain.  Skin: Negative.   Neurological: Negative for dizziness, tremors, speech change, focal weakness, seizures and headaches.  Endo/Heme/Allergies: Negative.  Does not bruise/bleed easily.  Psychiatric/Behavioral: Negative.  Negative for depression, hallucinations and suicidal ideas.     PHYSICAL EXAMINATION:  GENERAL:  62 y.o.-year-old patient lying in the bed with no acute distress. Thin frail NECK:  Supple, no jugular venous distention. No thyroid enlargement, no tenderness.  LUNGS: Normal breath sounds bilaterally, no wheezing, rales,rhonchi  No use of accessory muscles of respiration.  CARDIOVASCULAR: S1, S2 normal. No murmurs, rubs, or gallops.  ABDOMEN: Soft, non-tender, non-distended. Bowel sounds present. No organomegaly or mass.  EXTREMITIES: No pedal edema, cyanosis, or clubbing.  PSYCHIATRIC: The patient is alert and oriented x 3.  SKIN: No obvious rash, lesion, or ulcer.   DATA REVIEW:   CBC Recent Labs  Lab 06/26/18 1220  WBC 1.0*  HGB 9.3*  HCT 27.6*  PLT 75*    Chemistries  Recent Labs  Lab 06/26/18 1220 06/27/18 0613  NA 127* 133*  K 4.2 3.1*  CL 95* 106  CO2 18* 21*  GLUCOSE 111* 130*  BUN 51* 44*  CREATININE 3.35*  1.46*  CALCIUM 8.4* 7.7*  AST 15  --   ALT 24  --   ALKPHOS 197*  --   BILITOT 1.9*  --     Cardiac Enzymes Recent Labs  Lab 06/26/18 1220  TROPONINI <0.03    Microbiology Results  @[email or web address] @  RADIOLOGY:  Dg Chest 2 View  Result Date: 06/26/2018 CLINICAL DATA:  Shortness of breath over the past week. History of colon cancer. EXAM: CHEST - 2 VIEW COMPARISON:  PA and lateral chest 05/05/2018. FINDINGS: Port-A-Cath is unchanged. Lungs are clear. No pneumothorax or pleural effusion. No acute or focal bony abnormality. IMPRESSION: No acute disease. Electronically Signed   By: Inge Rise M.D.   On: 06/26/2018 13:48      Allergies as of 06/27/2018   No Known Allergies     Medication List    STOP taking these medications   amLODipine 10 MG tablet Commonly known as:  NORVASC   lisinopril 20 MG tablet Commonly known as:  PRINIVIL,ZESTRIL   metoprolol succinate 100 MG 24 hr tablet Commonly known as:  TOPROL-XL     TAKE these medications   cyanocobalamin 1000 MCG/ML injection Commonly known as:  (VITAMIN B-12) Inject 1,000 mcg into the muscle every 28 (twenty-eight) days.   denosumab 120 MG/1.7ML Soln injection Commonly known as:  XGEVA Inject 120 mg into the skin every 28 (twenty-eight) days.   dexamethasone 4 MG tablet Commonly known as:  DECADRON Take 8 mg by mouth every evening. On days 2 and 3 of each cycle   dicyclomine 10 MG capsule Commonly known as:  BENTYL Take 10 mg by mouth 4 (four) times daily -  before meals and at bedtime.   famotidine 20 MG tablet Commonly known as:  PEPCID Take 20 mg by mouth 2 (two) times daily.   LINZESS 145 MCG Caps capsule Generic drug:  linaclotide Take 145 mg by mouth daily before breakfast.   loperamide 2 MG capsule Commonly known as:  IMODIUM Take 2 mg by mouth 4 (four) times daily as needed for diarrhea or loose stools.   LYRICA 100 MG capsule Generic drug:  pregabalin Take 100-200 mg by mouth See admin  instructions. 100 mg every morning and 200 mg at bedtime   meloxicam 15 MG tablet Commonly known as:  MOBIC Take 15 mg by mouth daily.   ondansetron 8 MG tablet Commonly known as:  ZOFRAN Take 8 mg by mouth 2 (two) times daily.   promethazine 12.5 MG tablet Commonly known as:  PHENERGAN Take 12.5 mg by mouth every 6 (six) hours as needed for nausea or vomiting.   SAVELLA 50 MG Tabs tablet Generic drug:  Milnacipran Take 50 mg by mouth 2 (two) times daily.   SENNA LAXATIVE 8.6 MG tablet Generic drug:  senna Take 8.6 mg by mouth at bedtime.   traMADol 50 MG tablet Commonly known as:  ULTRAM Take 50 mg by mouth every 8 (eight) hours as needed for moderate pain or severe pain.           Management plans discussed with the patient and she is in agreement. Stable for discharge home  Patient should follow up with oncologist in [employer]:     Code Status Orders  (From admission, onward)         Start     Ordered   06/26/18 1648  Do not attempt resuscitation (DNR)  Continuous    Question Answer Comment  In the event of cardiac or respiratory ARREST Do not call a "code blue"   In the event of cardiac or respiratory ARREST Do not perform Intubation, CPR, defibrillation or ACLS   In the event of cardiac or respiratory ARREST Use medication by any route, position, wound care, and other measures to relive pain and suffering. May use oxygen, suction and manual treatment of airway obstruction as needed for comfort.   Comments Nurse may pronounce      06/26/18 1647        Code Status History    Date Active Date Inactive Code Status Order ID Comments User Context   05/05/2018 2039 05/10/2018 0052 DNR [id number]  Gladstone Lighter, MD Inpatient    Advance Directive Documentation     Most Recent Value  Type of Advance Directive  Out of facility DNR (pink MOST or yellow form)  Pre-existing out of facility DNR order (yellow form or pink MOST form)  -  "  MOST" Form in  Place?  -      TOTAL TIME TAKING CARE OF THIS PATIENT: 38 minutes.    Note: This dictation was prepared with Dragon dictation along with smaller phrase technology. Any transcriptional errors that result from this process are unintentional.  Yukio Bisping M.D on 06/27/2018 at 10:47 AM  Between 7am to 6pm - Pager - [phone] After 6pm go to [email or web address] - password [email or web address] [facility]  Office  [phone]  CC: Primary care physician; System, Pcp Not In

## 2018-06-27 NOTE — Progress Notes (Signed)
PT Cancellation Note  Patient Details Name: Tonetta Napoles MRN: [id number] DOB: [birth year]   Cancelled Treatment:    Reason Eval/Treat Not Completed: Patient declined, no reason specified;PT screened, no needs identified, will sign off.  Pt politely refused therapy, stating that she has been walking to bathroom and only limiting factor is becoming slightly SOB.  She is not interested in participating in therapy when returning to her home in [place].  Will complete order for now.   Roxanne Gates, PT, DPT 06/27/2018, 11:19 AM

## 2019-04-05 DEATH — deceased

## 2019-12-12 IMAGING — CR DG CHEST 2V
2 series · 2 of 2 positions shown · non-contrast
Comparison: None.

CLINICAL DATA: Fever.  Colon carcinoma

EXAM:
CHEST - 2 VIEW

[chest lat]
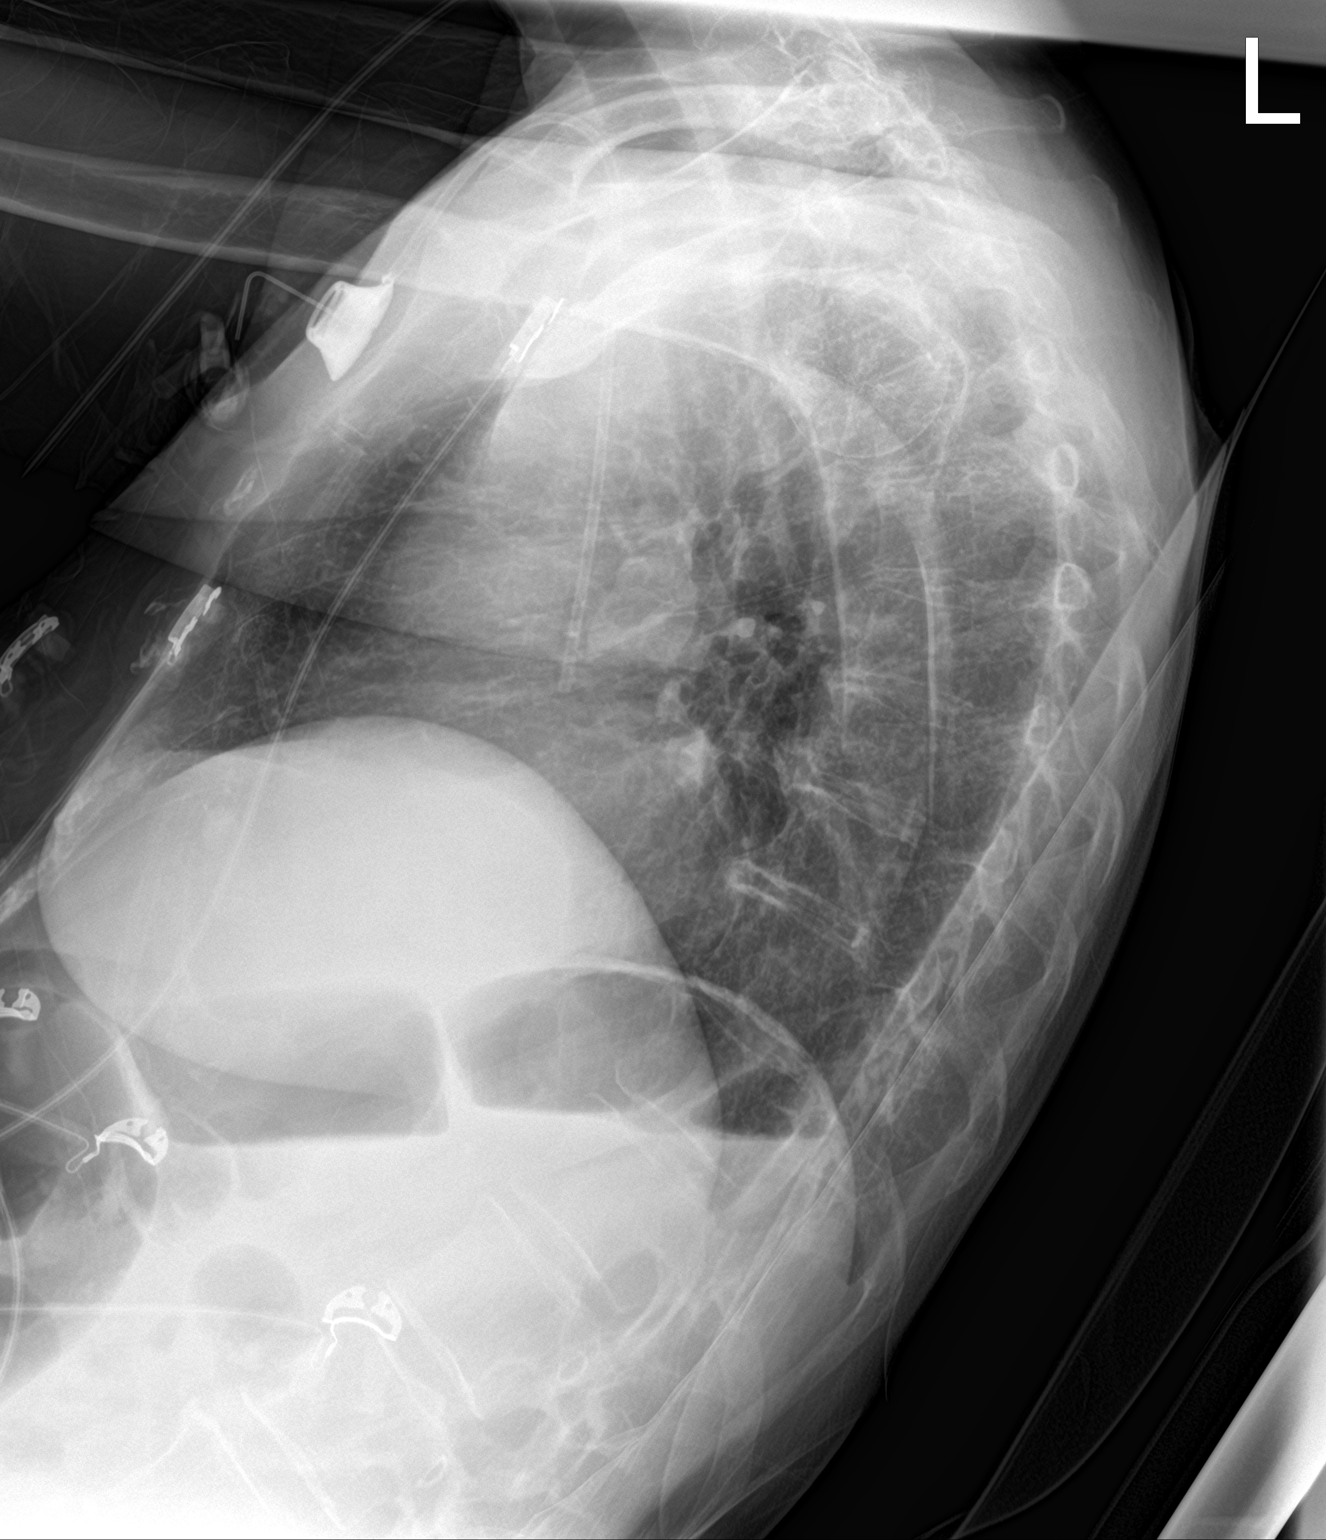

[chest ap]
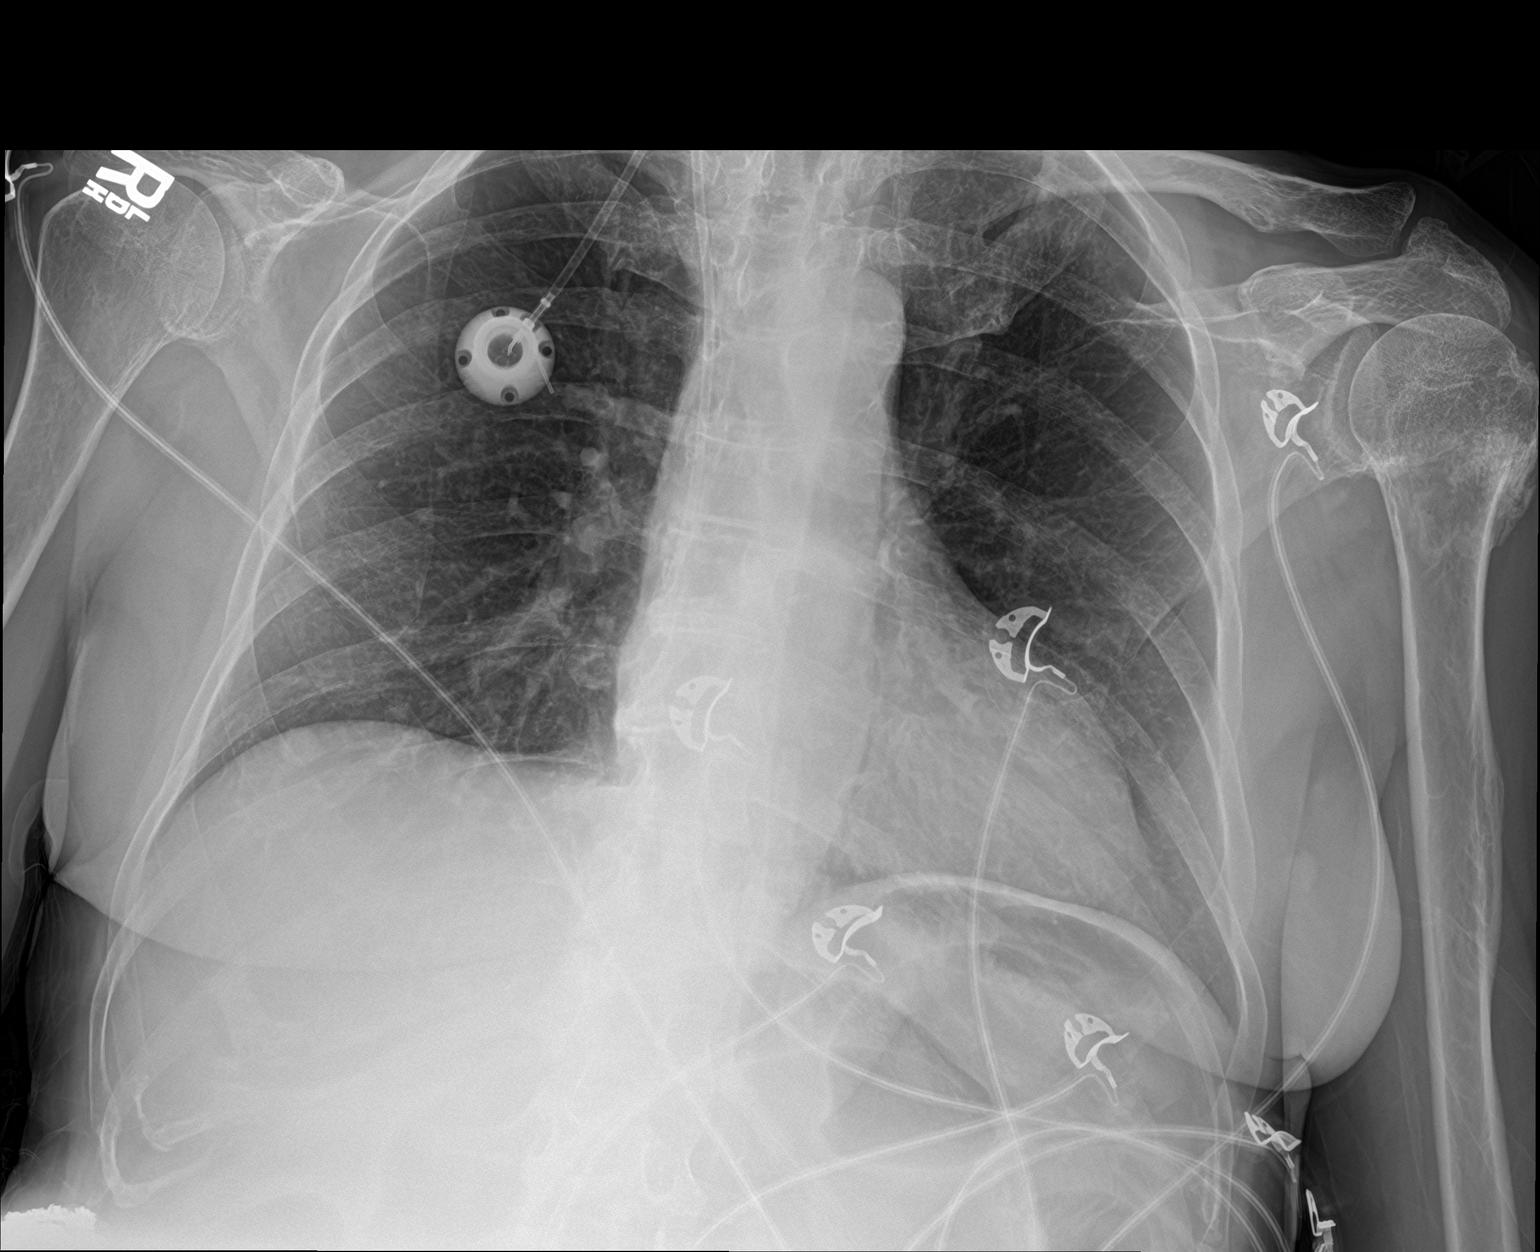

[2 of 2 positions shown; findings below may reference images not displayed]

FINDINGS: Port-A-Cath tip is at the cavoatrial junction. No pneumothorax.
Lungs are clear. Heart size and pulmonary vascularity are normal. No
adenopathy. There is evidence of old trauma involving the proximal
left humerus. Bones overall appear osteoporotic.
IMPRESSION: Port-A-Cath tip at cavoatrial junction. No pneumothorax. No edema or
consolidation. No adenopathy. Old trauma proximal left humerus with
healing.
